# Patient Record
Sex: Female | Born: 1940 | Race: Black or African American | Hispanic: No | Marital: Single | Smoking: Heavy tobacco smoker
Health system: Southern US, Community
[De-identification: ages and names within clinical notes are randomized; demographics above are authoritative.]

## PROBLEM LIST (undated history)

## (undated) DIAGNOSIS — D496 Neoplasm of unspecified behavior of brain: Secondary | ICD-10-CM

## (undated) DIAGNOSIS — I10 Essential (primary) hypertension: Secondary | ICD-10-CM

## (undated) DIAGNOSIS — K219 Gastro-esophageal reflux disease without esophagitis: Secondary | ICD-10-CM

## (undated) DIAGNOSIS — E7800 Pure hypercholesterolemia, unspecified: Secondary | ICD-10-CM

## (undated) HISTORY — PX: BRAIN SURGERY: SHX531

## (undated) HISTORY — PX: ABDOMINAL HYSTERECTOMY: SHX81

---

## 2014-06-21 ENCOUNTER — Encounter (HOSPITAL_BASED_OUTPATIENT_CLINIC_OR_DEPARTMENT_OTHER): Payer: Self-pay | Admitting: *Deleted

## 2014-06-21 ENCOUNTER — Inpatient Hospital Stay (HOSPITAL_BASED_OUTPATIENT_CLINIC_OR_DEPARTMENT_OTHER)
Admission: EM | Admit: 2014-06-21 | Discharge: 2014-06-26 | DRG: 853 | Disposition: A | Discharge status: Home / Self Care | Payer: Medicare Other | Attending: Internal Medicine | Admitting: Internal Medicine

## 2014-06-21 ENCOUNTER — Emergency Department (HOSPITAL_BASED_OUTPATIENT_CLINIC_OR_DEPARTMENT_OTHER): Payer: Medicare Other

## 2014-06-21 ENCOUNTER — Inpatient Hospital Stay (HOSPITAL_COMMUNITY): Payer: Medicare Other

## 2014-06-21 DIAGNOSIS — E785 Hyperlipidemia, unspecified: Secondary | ICD-10-CM | POA: Diagnosis not present

## 2014-06-21 DIAGNOSIS — K858 Other acute pancreatitis without necrosis or infection: Secondary | ICD-10-CM

## 2014-06-21 DIAGNOSIS — R6521 Severe sepsis with septic shock: Secondary | ICD-10-CM | POA: Diagnosis not present

## 2014-06-21 DIAGNOSIS — K219 Gastro-esophageal reflux disease without esophagitis: Secondary | ICD-10-CM | POA: Diagnosis not present

## 2014-06-21 DIAGNOSIS — K851 Biliary acute pancreatitis without necrosis or infection: Secondary | ICD-10-CM | POA: Diagnosis present

## 2014-06-21 DIAGNOSIS — K8000 Calculus of gallbladder with acute cholecystitis without obstruction: Secondary | ICD-10-CM | POA: Diagnosis present

## 2014-06-21 DIAGNOSIS — A419 Sepsis, unspecified organism: Secondary | ICD-10-CM

## 2014-06-21 DIAGNOSIS — E860 Dehydration: Secondary | ICD-10-CM | POA: Diagnosis not present

## 2014-06-21 DIAGNOSIS — E876 Hypokalemia: Secondary | ICD-10-CM | POA: Diagnosis not present

## 2014-06-21 DIAGNOSIS — K8309 Other cholangitis: Secondary | ICD-10-CM | POA: Diagnosis present

## 2014-06-21 DIAGNOSIS — E861 Hypovolemia: Secondary | ICD-10-CM | POA: Diagnosis not present

## 2014-06-21 DIAGNOSIS — I10 Essential (primary) hypertension: Secondary | ICD-10-CM | POA: Diagnosis present

## 2014-06-21 DIAGNOSIS — I959 Hypotension, unspecified: Secondary | ICD-10-CM

## 2014-06-21 DIAGNOSIS — R109 Unspecified abdominal pain: Secondary | ICD-10-CM | POA: Insufficient documentation

## 2014-06-21 DIAGNOSIS — A4151 Sepsis due to Escherichia coli [E. coli]: Principal | ICD-10-CM | POA: Diagnosis present

## 2014-06-21 DIAGNOSIS — K830 Cholangitis: Secondary | ICD-10-CM | POA: Diagnosis not present

## 2014-06-21 DIAGNOSIS — N179 Acute kidney failure, unspecified: Secondary | ICD-10-CM | POA: Diagnosis not present

## 2014-06-21 DIAGNOSIS — R1013 Epigastric pain: Secondary | ICD-10-CM

## 2014-06-21 DIAGNOSIS — R17 Unspecified jaundice: Secondary | ICD-10-CM

## 2014-06-21 HISTORY — DX: Essential (primary) hypertension: I10

## 2014-06-21 HISTORY — DX: Neoplasm of unspecified behavior of brain: D49.6

## 2014-06-21 HISTORY — DX: Pure hypercholesterolemia, unspecified: E78.00

## 2014-06-21 HISTORY — DX: Gastro-esophageal reflux disease without esophagitis: K21.9

## 2014-06-21 LAB — URINE MICROSCOPIC-ADD ON

## 2014-06-21 LAB — COMPREHENSIVE METABOLIC PANEL
ALBUMIN: 3.5 g/dL (ref 3.5–5.2)
ALT: 269 U/L — ABNORMAL HIGH (ref 0–35)
AST: 196 U/L — AB (ref 0–37)
Alkaline Phosphatase: 226 U/L — ABNORMAL HIGH (ref 39–117)
Anion gap: 14 (ref 5–15)
BUN: 37 mg/dL — ABNORMAL HIGH (ref 6–23)
CALCIUM: 8.9 mg/dL (ref 8.4–10.5)
CO2: 21 mmol/L (ref 19–32)
CREATININE: 3.81 mg/dL — AB (ref 0.50–1.10)
Chloride: 102 mEq/L (ref 96–112)
GFR calc Af Amer: 13 mL/min — ABNORMAL LOW (ref >90)
GFR, EST NON AFRICAN AMERICAN: 11 mL/min — AB (ref >90)
Glucose, Bld: 121 mg/dL — ABNORMAL HIGH (ref 70–99)
Potassium: 3.9 mmol/L (ref 3.5–5.1)
Sodium: 137 mmol/L (ref 135–145)
Total Bilirubin: 6.9 mg/dL — ABNORMAL HIGH (ref 0.3–1.2)
Total Protein: 7.6 g/dL (ref 6.0–8.3)

## 2014-06-21 LAB — MRSA PCR SCREENING: MRSA BY PCR: NEGATIVE

## 2014-06-21 LAB — CBC WITH DIFFERENTIAL/PLATELET
BAND NEUTROPHILS: 17 % — AB (ref 0–10)
BASOS PCT: 0 % (ref 0–1)
Basophils Absolute: 0 10*3/uL (ref 0.0–0.1)
Blasts: 0 %
EOS PCT: 0 % (ref 0–5)
Eosinophils Absolute: 0 10*3/uL (ref 0.0–0.7)
HEMATOCRIT: 36.6 % (ref 36.0–46.0)
HEMOGLOBIN: 12.2 g/dL (ref 12.0–15.0)
LYMPHS ABS: 0.9 10*3/uL (ref 0.7–4.0)
LYMPHS PCT: 4 % — AB (ref 12–46)
MCH: 31.6 pg (ref 26.0–34.0)
MCHC: 33.3 g/dL (ref 30.0–36.0)
MCV: 94.8 fL (ref 78.0–100.0)
MONO ABS: 1.1 10*3/uL — AB (ref 0.1–1.0)
MONOS PCT: 5 % (ref 3–12)
Metamyelocytes Relative: 2 %
Myelocytes: 2 %
Neutro Abs: 20.3 10*3/uL — ABNORMAL HIGH (ref 1.7–7.7)
Neutrophils Relative %: 70 % (ref 43–77)
Platelets: 176 10*3/uL (ref 150–400)
Promyelocytes Absolute: 0 %
RBC: 3.86 MIL/uL — ABNORMAL LOW (ref 3.87–5.11)
RDW: 12.9 % (ref 11.5–15.5)
WBC: 22.3 10*3/uL — AB (ref 4.0–10.5)
nRBC: 0 /100 WBC

## 2014-06-21 LAB — TROPONIN I: TROPONIN I: 0.03 ng/mL (ref <0.031)

## 2014-06-21 LAB — URINALYSIS, ROUTINE W REFLEX MICROSCOPIC
Glucose, UA: NEGATIVE mg/dL
Hgb urine dipstick: NEGATIVE
Ketones, ur: 15 mg/dL — AB
Nitrite: POSITIVE — AB
Protein, ur: NEGATIVE mg/dL
Specific Gravity, Urine: 1.018 (ref 1.005–1.030)
UROBILINOGEN UA: 1 mg/dL (ref 0.0–1.0)
pH: 6 (ref 5.0–8.0)

## 2014-06-21 LAB — I-STAT CG4 LACTIC ACID, ED: Lactic Acid, Venous: 3.49 mmol/L — ABNORMAL HIGH (ref 0.5–2.2)

## 2014-06-21 LAB — LACTIC ACID, PLASMA: Lactic Acid, Venous: 1.6 mmol/L (ref 0.5–2.2)

## 2014-06-21 LAB — GLUCOSE, CAPILLARY: Glucose-Capillary: 73 mg/dL (ref 70–99)

## 2014-06-21 LAB — LIPASE, BLOOD: LIPASE: 578 U/L — AB (ref 11–59)

## 2014-06-21 MED ORDER — SODIUM CHLORIDE 0.9 % IV SOLN
INTRAVENOUS | Status: DC
  Administered 2014-06-21 – 2014-06-25 (×9): via INTRAVENOUS

## 2014-06-21 MED ORDER — HEPARIN SODIUM (PORCINE) 5000 UNIT/ML IJ SOLN
5000.0000 [IU] | Freq: Three times a day (TID) | INTRAMUSCULAR | Status: DC
Start: 2014-06-21 — End: 2014-06-26
  Administered 2014-06-21 – 2014-06-26 (×13): 5000 [IU] via SUBCUTANEOUS
  Filled 2014-06-21 (×18): qty 1

## 2014-06-21 MED ORDER — ONDANSETRON HCL 4 MG/2ML IJ SOLN: 4.0000 mg | Freq: Once | INTRAMUSCULAR | Status: DC

## 2014-06-21 MED ORDER — METRONIDAZOLE IN NACL 5-0.79 MG/ML-% IV SOLN
500.0000 mg | Freq: Three times a day (TID) | INTRAVENOUS | Status: DC
  Administered 2014-06-21 – 2014-06-26 (×15): 500 mg via INTRAVENOUS
  Filled 2014-06-21 (×17): qty 100

## 2014-06-21 MED ORDER — MORPHINE SULFATE 2 MG/ML IJ SOLN
1.0000 mg | INTRAMUSCULAR | Status: DC | PRN
  Administered 2014-06-21: 1 mg via INTRAVENOUS
  Administered 2014-06-22 – 2014-06-23 (×2): 2 mg via INTRAVENOUS
  Filled 2014-06-21 (×3): qty 1

## 2014-06-21 MED ORDER — PIPERACILLIN-TAZOBACTAM 3.375 G IVPB 30 MIN
3.3750 g | Freq: Once | INTRAVENOUS | Status: AC
  Administered 2014-06-21: 3.375 g via INTRAVENOUS
  Filled 2014-06-21 (×2): qty 50

## 2014-06-21 MED ORDER — CIPROFLOXACIN IN D5W 400 MG/200ML IV SOLN
400.0000 mg | INTRAVENOUS | Status: DC
  Administered 2014-06-21 – 2014-06-22 (×2): 400 mg via INTRAVENOUS
  Filled 2014-06-21 (×4): qty 200

## 2014-06-21 MED ORDER — SODIUM CHLORIDE 0.9 % IV SOLN
INTRAVENOUS | Status: DC
Start: 2014-06-21 — End: 2014-06-21

## 2014-06-21 MED ORDER — CETYLPYRIDINIUM CHLORIDE 0.05 % MT LIQD: 7.0000 mL | Freq: Two times a day (BID) | OROMUCOSAL | Status: DC

## 2014-06-21 MED ORDER — ONDANSETRON HCL 4 MG/2ML IJ SOLN: 4.0000 mg | Freq: Four times a day (QID) | INTRAMUSCULAR | Status: DC | PRN

## 2014-06-21 MED ORDER — CHLORHEXIDINE GLUCONATE 0.12 % MT SOLN
15.0000 mL | Freq: Two times a day (BID) | OROMUCOSAL | Status: DC
  Administered 2014-06-22: 15 mL via OROMUCOSAL
  Filled 2014-06-21: qty 15

## 2014-06-21 MED ORDER — PNEUMOCOCCAL VAC POLYVALENT 25 MCG/0.5ML IJ INJ
0.5000 mL | INJECTION | INTRAMUSCULAR | Status: AC
  Administered 2014-06-22: 0.5 mL via INTRAMUSCULAR
  Filled 2014-06-21: qty 0.5

## 2014-06-21 MED ORDER — SODIUM CHLORIDE 0.9 % IV BOLUS (SEPSIS)
500.0000 mL | Freq: Once | INTRAVENOUS | Status: AC
  Administered 2014-06-21: 500 mL via INTRAVENOUS

## 2014-06-21 MED ORDER — SODIUM CHLORIDE 0.9 % IV BOLUS (SEPSIS)
1000.0000 mL | Freq: Once | INTRAVENOUS | Status: AC
  Administered 2014-06-21: 1000 mL via INTRAVENOUS

## 2014-06-21 MED ORDER — PANTOPRAZOLE SODIUM 40 MG IV SOLR
40.0000 mg | INTRAVENOUS | Status: DC
  Administered 2014-06-21 – 2014-06-23 (×3): 40 mg via INTRAVENOUS
  Filled 2014-06-21 (×6): qty 40

## 2014-06-21 NOTE — ED Notes (Signed)
Critical Value Lactic Acid 3.49 reported to Dr. [name] and Tana Coast RN

## 2014-06-21 NOTE — ED Provider Notes (Signed)
CSN: [id number]     Arrival date & time 06/21/14  1103 History   First MD Initiated Contact with Patient 06/21/14 1120     Chief Complaint  Patient presents with  . Fatigue     (Consider location/radiation/quality/duration/timing/severity/associated sxs/prior Treatment) The history is provided by the patient and a relative.  Chelsea Walker is a 73 y.o. female hx of brain tumor s/p surgery in remission here with vomiting, abdominal pain. She's been having periumbilical pain for the last several days. Also has been vomiting everything she eats. She is visiting family from out of town. She had some subjective fevers. She has been feeling weak and dizzy but didn't pass out. Denies any chest pain or shortness of breath.   Past Medical History  Diagnosis Date  . Brain tumor    Past Surgical History  Procedure Laterality Date  . Brain surgery     No family history on file. History  Substance Use Topics  . Smoking status: Not on file  . Smokeless tobacco: Not on file  . Alcohol Use: Not on file   OB History    No data available     Review of Systems  Gastrointestinal: Positive for nausea, vomiting and abdominal pain.  All other systems reviewed and are negative.     Allergies  Review of patient's allergies indicates no known allergies.  Home Medications   Prior to Admission medications   Medication Sig Start Date End Date Taking? Authorizing Provider  Fish Oil OIL by Does not apply route.   Yes Historical Provider, MD  omeprazole (PRILOSEC) 20 MG capsule Take 20 mg by mouth daily.   Yes Historical Provider, MD  pravastatin (PRAVACHOL) 20 MG tablet Take 20 mg by mouth daily.   Yes Historical Provider, MD   BP 90/69 mmHg  Pulse 40  Temp(Src) 98.5 F (36.9 C) (Oral)  Resp 22  SpO2 94% Physical Exam  Constitutional: She is oriented to person, place, and time.  Ill appearing, dehydrated   HENT:  Head: Normocephalic.  MM dry   Eyes: Conjunctivae are normal. Pupils  are equal, round, and reactive to light.  Neck: Normal range of motion. Neck supple.  Cardiovascular: Normal rate, regular rhythm and normal heart sounds.   Pulmonary/Chest: Effort normal and breath sounds normal. No respiratory distress. She has no wheezes. She has no rales.  Abdominal:  Mild epigastric and periumbilical tenderness, no rebound   Musculoskeletal: Normal range of motion. She exhibits no edema or tenderness.  Neurological: She is alert and oriented to person, place, and time. No cranial nerve deficit. Coordination normal.  Skin: Skin is warm and dry.  Psychiatric: She has a normal mood and affect. Her behavior is normal. Judgment and thought content normal.  Nursing note and vitals reviewed.   ED Course  Procedures (including critical care time)  CRITICAL CARE Performed by: [name], Jeanie Mccard   Total critical care time: 30 min   Critical care time was exclusive of separately billable procedures and treating other patients.  Critical care was necessary to treat or prevent imminent or life-threatening deterioration.  Critical care was time spent personally by me on the following activities: development of treatment plan with patient and/or surrogate as well as nursing, discussions with consultants, evaluation of patient's response to treatment, examination of patient, obtaining history from patient or surrogate, ordering and performing treatments and interventions, ordering and review of laboratory studies, ordering and review of radiographic studies, pulse oximetry and re-evaluation of patient's condition.   Labs  Review Labs Reviewed  CBC WITH DIFFERENTIAL - Abnormal; Notable for the following:    WBC 22.3 (*)    RBC 3.86 (*)    Lymphocytes Relative 4 (*)    Band Neutrophils 17 (*)    Neutro Abs 20.3 (*)    Monocytes Absolute 1.1 (*)    All other components within normal limits  COMPREHENSIVE METABOLIC PANEL - Abnormal; Notable for the following:    Glucose, Bld 121 (*)     BUN 37 (*)    Creatinine, Ser 3.81 (*)    AST 196 (*)    ALT 269 (*)    Alkaline Phosphatase 226 (*)    Total Bilirubin 6.9 (*)    GFR calc non Af Amer 11 (*)    GFR calc Af Amer 13 (*)    All other components within normal limits  LIPASE, BLOOD - Abnormal; Notable for the following:    Lipase 578 (*)    All other components within normal limits  URINALYSIS, ROUTINE W REFLEX MICROSCOPIC - Abnormal; Notable for the following:    Color, Urine ORANGE (*)    Bilirubin Urine LARGE (*)    Ketones, ur 15 (*)    Nitrite POSITIVE (*)    Leukocytes, UA SMALL (*)    All other components within normal limits  URINE MICROSCOPIC-ADD ON - Abnormal; Notable for the following:    Bacteria, UA FEW (*)    All other components within normal limits  I-STAT CG4 LACTIC ACID, ED - Abnormal; Notable for the following:    Lactic Acid, Venous 3.49 (*)    All other components within normal limits  CULTURE, BLOOD (ROUTINE X 2)  CULTURE, BLOOD (ROUTINE X 2)  URINE CULTURE  TROPONIN I    Imaging Review Ct Abdomen Pelvis Wo Contrast  06/21/2014   CLINICAL DATA:  Vomiting with eating.  Back pain.  EXAM: CT ABDOMEN AND PELVIS WITHOUT CONTRAST  TECHNIQUE: Multidetector CT imaging of the abdomen and pelvis was performed following the standard protocol without IV contrast.  COMPARISON:  None.  FINDINGS: Linear densities at both lung bases are most compatible with atelectasis. Evidence for a small calcified granuloma at the left lung base. Negative for free intraperitoneal air.  There is high-density material in the gallbladder consistent with stones and/or sludge. Gallbladder is mildly distended. No gross abnormality to the liver, spleen or pancreas. Normal appearance of the adrenal glands and kidneys. Negative for kidney stones or hydronephrosis. Abdominal aorta measures up to 2.3 cm with atherosclerotic calcifications. No significant free fluid or lymphadenopathy. Uterus has been removed. Normal appearance of the  small bowel, appendix and colon. Evidence for a small splenule inferior to the spleen. There is some colonic diverticulosis without acute inflammation. Two linear densities in the rectum are consistent with ingested material. Urinary bladder is decompressed.  There is grade 2 anterolisthesis at L5-S1. There may be a right-sided pars defect at L5. Multilevel disc disease in lumbar spine. There is an umbilical hernia containing fat.  IMPRESSION: Cholelithiasis.  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   Electronically Signed   By: Markus Daft M.D.   On: 06/21/2014 13:28   Dg Chest 2 View  06/21/2014   CLINICAL DATA:  Vomiting for 2 days  EXAM: CHEST  2 VIEW  COMPARISON:  None.  FINDINGS: Cardiomediastinal silhouette is unremarkable. No acute infiltrate or pleural effusion. Osteopenia and mild degenerative changes lower thoracic and lumbar spine. Mild compression deformity upper endplate of  T11 vertebral body of indeterminate age.  IMPRESSION: No active disease. Mild degenerative changes thoracic spine. Mild compression deformity upper endplate of R97 vertebral body of indeterminate age.   Electronically Signed   By: Lahoma Crocker M.D.   On: 06/21/2014 13:48     EKG Interpretation   Date/Time:  Friday June 21 2014 11:35:10 EST Ventricular Rate:  108 PR Interval:  128 QRS Duration: 84 QT Interval:  328 QTC Calculation: 439 R Axis:   28 Text Interpretation:  Sinus tachycardia Otherwise normal ECG No previous  ECGs available Confirmed by Millard Bautch  MD, Talyn Dessert ([id number]) on 06/21/2014 11:43:25  AM      MDM   Final diagnoses:  Hypotension  Abdominal pain    Chelsea Walker is a 73 y.o. female here with ab pain, vomiting, hypotension. Consider AAA vs appy vs perforation vs dehydration from gastro. Will check labs, CT angio ab/pel, UA.   12 pm WBC 22. Still hypotensive after 1L NS. Code sepsis initiated. Has acute renal failure Cr. 3.8. LFTs elevated. Ordered zosyn.  Changed CT to noncontrast.   1:53 PM CT showed gallstones. Still hypotensive, on 3rd L NS. Concerned for possible cholangitis vs choledocholithiasis. Lipase 578, likely has pancreatitis as well. Will admit to ICU at [facility], Dr. Halford Chessman accepting doctor. General surgery consulted as well.     Wandra Arthurs, MD 06/21/14 [phone]

## 2014-06-21 NOTE — H&P (Signed)
PULMONARY / CRITICAL CARE MEDICINE   Name: Chelsea Walker MRN: [id number] DOB: December 27, 1940    ADMISSION DATE:  06/21/2014  REFERRING MD :  [name]  CHIEF COMPLAINT:  Abdominal pain  INITIAL PRESENTATION:  73 yo female with n/v and abdominal pain.  Hypotensive with elevated lactic in ER.  Found to have elevated WBC, lipase, LFT's with concern for gallstone pancreatitis.  PCCM asked to admit to ICU.  STUDIES:  12/25 CT abd/pelvis >> cholelithiasis, anterolithiasis L5-S1 12/25 Abd u/s >>  SIGNIFICANT EVENTS: 12/25 Admit, CCS/GI consulted  HISTORY OF PRESENT ILLNESS:   73 yo female visiting family for Christmas.  She ate Christmas dinner last night.  Afterward she developed nausea with vomiting.  This was associated with abdominal pain and back pain.  She went to ER.  She was found to have low blood pressure.  She had CT abd/pelvis which showed gallstones.  Her WBC, lipase, lactic acid and LFT's were elevated.  She was given zofran, IV fluids, and Abx >> she felt better after these interventions.  She was transferred to ICU with concern for sepsis in setting of gallstone pancreatitis.  She denies chest pain, dyspnea, fever, sweats, chills, or diarrhea.  She had normal bowel movement last night.  PAST MEDICAL HISTORY :   has a past medical history of Brain tumor.  has past surgical history that includes Brain surgery. Prior to Admission medications   Medication Sig Start Date End Date Taking? Authorizing Provider  Fish Oil OIL by Does not apply route.   Yes Historical Provider, MD  omeprazole (PRILOSEC) 20 MG capsule Take 20 mg by mouth daily.   Yes Historical Provider, MD  pravastatin (PRAVACHOL) 20 MG tablet Take 20 mg by mouth daily.   Yes Historical Provider, MD   No Known Allergies  FAMILY HISTORY:  has no family status information on file.  SOCIAL HISTORY:    REVIEW OF SYSTEMS:   Negative except above  SUBJECTIVE:   VITAL SIGNS: Temp:  [98.5 F (36.9 C)] 98.5 F (36.9 C)  (12/25 1113) Pulse Rate:  [36-109] 99 (12/25 1515) Resp:  [16-29] 29 (12/25 1515) BP: (69-99)/(42-81) 97/53 mmHg (12/25 1515) SpO2:  [94 %-100 %] 98 % (12/25 1515) INTAKE / OUTPUT:  Intake/Output Summary (Last 24 hours) at 06/21/14 1637 Last data filed at 06/21/14 1335  Gross per 24 hour  Intake   1050 ml  Output      0 ml  Net   1050 ml    PHYSICAL EXAMINATION: General:  Pleasant  Neuro:  Rt facial drop, normal strength, follows commands HEENT: no sinus tenderness, no oral exudate, dry oral mucosa Cardiovascular:  Regular, no murmur Lungs:  No wheeze/rales Abdomen:  Soft, non tender, negative Murphy's sign, + bowel sounds Musculoskeletal: no edema Skin:  No rashes  LABS:  CBC  Recent Labs Lab 06/21/14 1140  WBC 22.3*  HGB 12.2  HCT 36.6  PLT 176   Coag's No results for input(s): APTT, INR in the last 168 hours. BMET  Recent Labs Lab 06/21/14 1140  NA 137  K 3.9  CL 102  CO2 21  BUN 37*  CREATININE 3.81*  GLUCOSE 121*   Electrolytes  Recent Labs Lab 06/21/14 1140  CALCIUM 8.9   Sepsis Markers  Recent Labs Lab 06/21/14 1147  LATICACIDVEN 3.49*   ABG No results for input(s): PHART, PCO2ART, PO2ART in the last 168 hours. Liver Enzymes  Recent Labs Lab 06/21/14 1140  AST 196*  ALT 269*  ALKPHOS 226*  BILITOT 6.9*  ALBUMIN 3.5   Cardiac Enzymes  Recent Labs Lab 06/21/14 1140  TROPONINI 0.03   Glucose  Recent Labs Lab 06/21/14 1613  GLUCAP 73    Imaging No results found.   ASSESSMENT / PLAN:  PULMONARY A: No acute issues. P:   Bronchial hygiene  CARDIOVASCULAR A:  Sepsis 2nd to gallstone pancreatitis. Hx of HLD. P:  Continue IV fluids Defer CVL placement for now F/u lactic acid Hold pravachol  RENAL A:   AKI 2nd to sepsis and volume depletion >> uncertain about baseline renal fx. P:   Monitor renal fx, urine outpt, electrolytes while getting IV fluids  GASTROINTESTINAL A:   Gallstone  pancreatitis. P:   NPO Protonix for SUP GI, CCS consulted F/u abd u/s   HEMATOLOGIC A:   Leukocytosis. P:  F/u CBC SQ heparin for DVT prevention  INFECTIOUS A:   Sepsis from gallstone pancreatitis. P:   Day 1 cipro, flagyl  Blood cx 12/25 >> Urine cx 12/25 >>   ENDOCRINE A:   No acute issues. P:   Monitor blood sugar on BMET  NEUROLOGIC A:   Pain control. Hx of brain tumor s/p resection. P:   PRN morphine  Updated family at bedside 12/25.  Inter-disciplinary family meet or Palliative Care meeting due by 06/28/14:  Chelsea Mires, MD [place] 06/21/2014, 4:51 PM Pager:  [phone] After 3pm call: [phone]

## 2014-06-21 NOTE — Consult Note (Signed)
Reason for Consult:cholangitis, pancreatitis, gallstones Referring Physician: Dr. Governor Rooks  Chelsea Walker is an 73 y.o. female.  HPI: This patient was transferred in from [facility]. She is from out of town visiting family. She presents with a 3 to four-day history of abdominal pain, nausea, and vomiting. She reports this occurred after eating something fatty. The pain is also been in the epigastrium. She has subjective fevers as well as weakness and dizziness. She denies chest pain or shortness of breath. She has no previous history of abdominal pain. She has been moving her bowels normally. The pain was moderate and cramping in intensity. She denied hematemesis.  While at [facility], she was hypotensive. She was transferred here by CCM to the intensive care area.  Past Medical History  Diagnosis Date  . Brain tumor     Past Surgical History  Procedure Laterality Date  . Brain surgery      No family history on file.  Social History:  has no tobacco, alcohol, and drug history on file.  Allergies: No Known Allergies  Medications: I have reviewed the patient's current medications.  Results for orders placed or performed during the hospital encounter of 06/21/14 (from the past 48 hour(s))  CBC with Differential     Status: Abnormal   Collection Time: 06/21/14 11:40 AM  Result Value Ref Range   WBC 22.3 (H) 4.0 - 10.5 K/uL   RBC 3.86 (L) 3.87 - 5.11 MIL/uL   Hemoglobin 12.2 12.0 - 15.0 g/dL   HCT 36.6 36.0 - 46.0 %   MCV 94.8 78.0 - 100.0 fL   MCH 31.6 26.0 - 34.0 pg   MCHC 33.3 30.0 - 36.0 g/dL   RDW 12.9 11.5 - 15.5 %   Platelets 176 150 - 400 K/uL   Neutrophils Relative % 70 43 - 77 %   Lymphocytes Relative 4 (L) 12 - 46 %   Monocytes Relative 5 3 - 12 %   Eosinophils Relative 0 0 - 5 %   Basophils Relative 0 0 - 1 %   Band Neutrophils 17 (H) 0 - 10 %   Metamyelocytes Relative 2 %   Myelocytes 2 %   Promyelocytes Absolute 0 %   Blasts 0 %   nRBC 0 0  /100 WBC   Neutro Abs 20.3 (H) 1.7 - 7.7 K/uL   Lymphs Abs 0.9 0.7 - 4.0 K/uL   Monocytes Absolute 1.1 (H) 0.1 - 1.0 K/uL   Eosinophils Absolute 0.0 0.0 - 0.7 K/uL   Basophils Absolute 0.0 0.0 - 0.1 K/uL   WBC Morphology VACUOLATED NEUTROPHILS     Comment: DOHLE BODIES  Comprehensive metabolic panel     Status: Abnormal   Collection Time: 06/21/14 11:40 AM  Result Value Ref Range   Sodium 137 135 - 145 mmol/L    Comment: Please note change in reference range.   Potassium 3.9 3.5 - 5.1 mmol/L    Comment: Please note change in reference range.   Chloride 102 96 - 112 mEq/L   CO2 21 19 - 32 mmol/L   Glucose, Bld 121 (H) 70 - 99 mg/dL   BUN 37 (H) 6 - 23 mg/dL   Creatinine, Ser 3.81 (H) 0.50 - 1.10 mg/dL   Calcium 8.9 8.4 - 10.5 mg/dL   Total Protein 7.6 6.0 - 8.3 g/dL   Albumin 3.5 3.5 - 5.2 g/dL   AST 196 (H) 0 - 37 U/L   ALT 269 (H) 0 - 35  U/L   Alkaline Phosphatase 226 (H) 39 - 117 U/L   Total Bilirubin 6.9 (H) 0.3 - 1.2 mg/dL   GFR calc non Af Amer 11 (L) >90 mL/min   GFR calc Af Amer 13 (L) >90 mL/min    Comment: (NOTE) The eGFR has been calculated using the CKD EPI equation. This calculation has not been validated in all clinical situations. eGFR's persistently <90 mL/min signify possible Chronic Kidney Disease.    Anion gap 14 5 - 15  Lipase, blood     Status: Abnormal   Collection Time: 06/21/14 11:40 AM  Result Value Ref Range   Lipase 578 (H) 11 - 59 U/L    Comment: RESULTS CONFIRMED BY MANUAL DILUTION CALLED TO NICOLE PASHADA AT 13:20 BY S.ROY ON 06/21/14   Troponin I     Status: None   Collection Time: 06/21/14 11:40 AM  Result Value Ref Range   Troponin I 0.03 <0.031 ng/mL    Comment:        NO INDICATION OF MYOCARDIAL INJURY. Please note change in reference range.   I-Stat CG4 Lactic Acid, ED     Status: Abnormal   Collection Time: 06/21/14 11:47 AM  Result Value Ref Range   Lactic Acid, Venous 3.49 (H) 0.5 - 2.2 mmol/L  Urinalysis, Routine w reflex  microscopic     Status: Abnormal   Collection Time: 06/21/14 12:20 PM  Result Value Ref Range   Color, Urine ORANGE (A) YELLOW    Comment: BIOCHEMICALS MAY BE AFFECTED BY COLOR   APPearance CLEAR CLEAR   Specific Gravity, Urine 1.018 1.005 - 1.030   pH 6.0 5.0 - 8.0   Glucose, UA NEGATIVE NEGATIVE mg/dL   Hgb urine dipstick NEGATIVE NEGATIVE   Bilirubin Urine LARGE (A) NEGATIVE   Ketones, ur 15 (A) NEGATIVE mg/dL   Protein, ur NEGATIVE NEGATIVE mg/dL   Urobilinogen, UA 1.0 0.0 - 1.0 mg/dL   Nitrite POSITIVE (A) NEGATIVE   Leukocytes, UA SMALL (A) NEGATIVE  Urine microscopic-add on     Status: Abnormal   Collection Time: 06/21/14 12:20 PM  Result Value Ref Range   Squamous Epithelial / LPF RARE RARE   WBC, UA 0-2 <3 WBC/hpf   RBC / HPF 0-2 <3 RBC/hpf   Bacteria, UA FEW (A) RARE   Urine-Other MUCOUS PRESENT   Glucose, capillary     Status: None   Collection Time: 06/21/14  4:13 PM  Result Value Ref Range   Glucose-Capillary 73 70 - 99 mg/dL    Ct Abdomen Pelvis Wo Contrast  06/21/2014   CLINICAL DATA:  Vomiting with eating.  Back pain.  EXAM: CT ABDOMEN AND PELVIS WITHOUT CONTRAST  TECHNIQUE: Multidetector CT imaging of the abdomen and pelvis was performed following the standard protocol without IV contrast.  COMPARISON:  None.  FINDINGS: Linear densities at both lung bases are most compatible with atelectasis. Evidence for a small calcified granuloma at the left lung base. Negative for free intraperitoneal air.  There is high-density material in the gallbladder consistent with stones and/or sludge. Gallbladder is mildly distended. No gross abnormality to the liver, spleen or pancreas. Normal appearance of the adrenal glands and kidneys. Negative for kidney stones or hydronephrosis. Abdominal aorta measures up to 2.3 cm with atherosclerotic calcifications. No significant free fluid or lymphadenopathy. Uterus has been removed. Normal appearance of the small bowel, appendix and colon.  Evidence for a small splenule inferior to the spleen. There is some colonic diverticulosis without acute inflammation. Two linear  densities in the rectum are consistent with ingested material. Urinary bladder is decompressed.  There is grade 2 anterolisthesis at L5-S1. There may be a right-sided pars defect at L5. Multilevel disc disease in lumbar spine. There is an umbilical hernia containing fat.  IMPRESSION: Cholelithiasis.  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   Electronically Signed   By: Markus Daft M.D.   On: 06/21/2014 13:28   Dg Chest 2 View  06/21/2014   CLINICAL DATA:  Vomiting for 2 days  EXAM: CHEST  2 VIEW  COMPARISON:  None.  FINDINGS: Cardiomediastinal silhouette is unremarkable. No acute infiltrate or pleural effusion. Osteopenia and mild degenerative changes lower thoracic and lumbar spine. Mild compression deformity upper endplate of Z66 vertebral body of indeterminate age.  IMPRESSION: No active disease. Mild degenerative changes thoracic spine. Mild compression deformity upper endplate of A63 vertebral body of indeterminate age.   Electronically Signed   By: Lahoma Crocker M.D.   On: 06/21/2014 13:48    Review of Systems  All other systems reviewed and are negative.  Blood pressure 97/53, pulse 99, temperature 98.5 F (36.9 C), temperature source Oral, resp. rate 29, SpO2 98 %. Physical Exam  Constitutional: She is oriented to person, place, and time. She appears well-developed and well-nourished. No distress.  HENT:  Head: Normocephalic and atraumatic.  Right Ear: External ear normal.  Left Ear: External ear normal.  Nose: Nose normal.  Mouth/Throat: No oropharyngeal exudate.  Eyes: Conjunctivae are normal. Pupils are equal, round, and reactive to light. Scleral icterus is present.  Neck: Normal range of motion. No tracheal deviation present.  Cardiovascular: Normal heart sounds and intact distal pulses.   No murmur heard. Tachycardic  with regular rhythm  Respiratory: Effort normal and breath sounds normal. No respiratory distress. She has no wheezes.  GI: Soft. She exhibits no distension. There is tenderness.  There is only very mild tenderness in the epigastrium and right upper quadrant with minimal guarding.  Musculoskeletal: Normal range of motion. She exhibits no edema or tenderness.  Neurological: She is alert and oriented to person, place, and time.  Skin: Skin is warm and dry. No rash noted. She is not diaphoretic. No erythema.  Psychiatric: Her behavior is normal. Judgment normal.    Assessment/Plan: Cholangitis with pancreatitis, cholelithiasis, and possible choledocholithiasis.  Clinically, she actually looks better than her labs and vitals. Her abdominal exam is not very impressive. Nonetheless, her laboratory data does show a significantly elevated white blood count and liver function tests. I will order ultrasound of her right upper quadrant to better assess the gallbladder and bile duct. I recommend GI consultation to consider potential ERCP should be determined of the bile duct needs to be decompressed. She will remain nothing by mouth. She is being started on IV antibiotics. We will follow her closely with you.  Chandra Asher A 06/21/2014, 4:37 PM

## 2014-06-21 NOTE — ED Notes (Signed)
Pt here visiting family for Christmas- c/o abd pain and vomiting "couple days"- b/p  76/55- pt denies complaints

## 2014-06-21 NOTE — Progress Notes (Signed)
ANTIBIOTIC CONSULT NOTE - INITIAL  Pharmacy Consult for Cipro Indication: intra-abdominal infection  No Known Allergies  Patient Measurements:   Adjusted Body Weight:   Vital Signs: Temp: 98.5 F (36.9 C) (12/25 1113) Temp Source: Oral (12/25 1113) BP: 97/53 mmHg (12/25 1515) Pulse Rate: 99 (12/25 1515) Intake/Output from previous day:   Intake/Output from this shift: Total I/O In: 1050 [IV Piggyback:1050] Out: -   Labs:  Recent Labs  06/21/14 1140  WBC 22.3*  HGB 12.2  PLT 176  CREATININE 3.81*   CrCl cannot be calculated (Unknown ideal weight.).    Microbiology: No results found for this or any previous visit (from the past 720 hour(s)).  Medical History: Past Medical History  Diagnosis Date  . Brain tumor     Medications:  Prescriptions prior to admission  Medication Sig Dispense Refill Last Dose  . Fish Oil OIL by Does not apply route.     Marland Kitchen omeprazole (PRILOSEC) 20 MG capsule Take 20 mg by mouth daily.     . pravastatin (PRAVACHOL) 20 MG tablet Take 20 mg by mouth daily.      Assessment: 73 yo F presented to ED with several days history of vomiting and abdominal pain.  Pt is hypotensive, WBC 22.3, e.evated LA 3.49.  Pts SCr 3.8 with estimated CrCl ~ 15 ml/min.  Goal of Therapy:  Renal dose adjustment of medications  Plan:  Cipro 400mg  IV q24h Follow up culture data and clinical progress.  [employer], Pharm.D., BCPS Clinical Pharmacist Pager [phone] 06/21/2014 4:42 PM

## 2014-06-21 NOTE — Consult Note (Addendum)
Referring Provider: Dr. Halford Chessman Primary Care Physician:  Pcp Not In System Primary Gastroenterologist:  Althia Forts  Reason for Consultation:  Pancreatitis; Elevated LFTs  HPI: Chelsea Walker is a 73 y.o. female who had the acute onset of epigastric abdominal pain last night with associated chills. Pain was sharp, stabbing, and was the worse pain in her life. Abdominal pain also radiated into her back. Denies any abdominal pain like this episode prior to last night. Denies associated N/V/fevers/diarrhea/melena/hematochezia. She was hypotensive on presentation to the ER at 69/44 and has responded with IVFs to the low 782'U systolic/ 50 's - 23'N and WBC 22.3, TB 6.9, ALP 226, AST 196, ALT 269. Lipase 578. Noncontrast CT showed gallstones with mild distention of the gallbladder without pancreatitis. U/S showed gallstones with mild gallbladder distention; CBD normal in diameter. She came in town yesterday to visit her daughter for the holidays.   Past Medical History  Diagnosis Date  . Brain tumor   . Hypertension   . GERD (gastroesophageal reflux disease)   . High cholesterol     Past Surgical History  Procedure Laterality Date  . Brain surgery    . Abdominal hysterectomy      Prior to Admission medications   Medication Sig Start Date End Date Taking? Authorizing Provider  Fish Oil OIL by Does not apply route.   Yes Historical Provider, MD  omeprazole (PRILOSEC) 20 MG capsule Take 20 mg by mouth daily.   Yes Historical Provider, MD  pravastatin (PRAVACHOL) 20 MG tablet Take 20 mg by mouth daily.   Yes Historical Provider, MD  lisinopril (PRINIVIL,ZESTRIL) 10 MG tablet Take 10 mg by mouth daily. 05/30/14   Historical Provider, MD    Scheduled Meds: . ciprofloxacin  400 mg Intravenous Q24H  . heparin subcutaneous  5,000 Units Subcutaneous 3 times per day  . metronidazole  500 mg Intravenous Q8H  . ondansetron (ZOFRAN) IV  4 mg Intravenous Once  . pantoprazole (PROTONIX) IV  40 mg  Intravenous Q24H   Continuous Infusions: . sodium chloride 100 mL/hr at 06/21/14 1712   PRN Meds:.morphine injection, ondansetron  Allergies as of 06/21/2014  . (No Known Allergies)    History reviewed. No pertinent family history.  History   Social History  . Marital Status: Single    Spouse Name: N/A    Number of Children: N/A  . Years of Education: N/A   Occupational History  . Not on file.   Social History Main Topics  . Smoking status: Heavy Tobacco Smoker  . Smokeless tobacco: Current User    Types: Chew  . Alcohol Use: Not on file  . Drug Use: Not on file  . Sexual Activity: Not on file   Other Topics Concern  . Not on file   Social History Narrative  . No narrative on file    Review of Systems: All negative from GI standpoint except as stated above in HPI.  Physical Exam: Vital signs: Filed Vitals:   06/21/14 1700  BP: 99/74  Pulse: 95  Temp: 98.4  Resp: 18   Last BM Date: 06/20/14 General:   Elderly, Alert,  Well-developed, well-nourished, pleasant and cooperative in NAD HEENT: +scleral icterus Neck: nontender Lungs:  Clear throughout to auscultation.   No wheezes, crackles, or rhonchi. No acute distress. Heart:  Regular rate and rhythm; no murmurs, clicks, rubs,  or gallops. Abdomen: minimal tenderness in epigastric and RUQ quadrant with guarding, soft, nondistended, +BS  Rectal:  Deferred Ext: no edema  GI:  Lab Results:  Recent Labs  06/21/14 1140  WBC 22.3*  HGB 12.2  HCT 36.6  PLT 176   BMET  Recent Labs  06/21/14 1140  NA 137  K 3.9  CL 102  CO2 21  GLUCOSE 121*  BUN 37*  CREATININE 3.81*  CALCIUM 8.9   LFT  Recent Labs  06/21/14 1140  PROT 7.6  ALBUMIN 3.5  AST 196*  ALT 269*  ALKPHOS 226*  BILITOT 6.9*   PT/INR No results for input(s): LABPROT, INR in the last 72 hours.   Studies/Results: Ct Abdomen Pelvis Wo Contrast  06/21/2014   CLINICAL DATA:  Vomiting with eating.  Back pain.  EXAM: CT  ABDOMEN AND PELVIS WITHOUT CONTRAST  TECHNIQUE: Multidetector CT imaging of the abdomen and pelvis was performed following the standard protocol without IV contrast.  COMPARISON:  None.  FINDINGS: Linear densities at both lung bases are most compatible with atelectasis. Evidence for a small calcified granuloma at the left lung base. Negative for free intraperitoneal air.  There is high-density material in the gallbladder consistent with stones and/or sludge. Gallbladder is mildly distended. No gross abnormality to the liver, spleen or pancreas. Normal appearance of the adrenal glands and kidneys. Negative for kidney stones or hydronephrosis. Abdominal aorta measures up to 2.3 cm with atherosclerotic calcifications. No significant free fluid or lymphadenopathy. Uterus has been removed. Normal appearance of the small bowel, appendix and colon. Evidence for a small splenule inferior to the spleen. There is some colonic diverticulosis without acute inflammation. Two linear densities in the rectum are consistent with ingested material. Urinary bladder is decompressed.  There is grade 2 anterolisthesis at L5-S1. There may be a right-sided pars defect at L5. Multilevel disc disease in lumbar spine. There is an umbilical hernia containing fat.  IMPRESSION: Cholelithiasis.  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   Electronically Signed   By: Markus Daft M.D.   On: 06/21/2014 13:28   Dg Chest 2 View  06/21/2014   CLINICAL DATA:  Vomiting for 2 days  EXAM: CHEST  2 VIEW  COMPARISON:  None.  FINDINGS: Cardiomediastinal silhouette is unremarkable. No acute infiltrate or pleural effusion. Osteopenia and mild degenerative changes lower thoracic and lumbar spine. Mild compression deformity upper endplate of Y19 vertebral body of indeterminate age.  IMPRESSION: No active disease. Mild degenerative changes thoracic spine. Mild compression deformity upper endplate of J09 vertebral body of  indeterminate age.   Electronically Signed   By: Lahoma Crocker M.D.   On: 06/21/2014 13:48   US Abdomen Limited Ruq  06/21/2014   CLINICAL DATA:  Cholangitis  EXAM: US ABDOMEN LIMITED - RIGHT UPPER QUADRANT  COMPARISON:  None.  FINDINGS: Gallbladder:  Multiple small gallstones are present. Mild gallbladder wall thickening. No pericholecystic fluid. No Murphy sign.  Common bile duct:  Diameter: 6 mm in caliber  Liver:  No focal lesion identified. Within normal limits in parenchymal echogenicity.  IMPRESSION: Cholelithiasis.  Nonspecific mild gallbladder wall thickening. Correlate clinically as for the need for nuclear medicine imaging. Please note that the sonographic Percell Miller sign was negative.   Electronically Signed   By: Maryclare Bean M.D.   On: 06/21/2014 18:41    Impression/Plan:  73 yo with biliary pancreatitis with normal-sized common bile duct on U/S. I do NOT think she has cholangitis and would recommend continued broad-spectrum antibiotics, bowel rest, and IVFs. If LFTs continue to rise, then may need an ERCP otherwise would recommend  continue Abx, bowel rest, and supportive care. Patient reports having a steel plate in her head so an MRCP would not be an option. Discussed with Dr. Deatra Ina in case an ERCP is needed this weekend. Will follow up tomorrow.   LOS: 0 days   [place] C.  06/21/2014, 7:24 PM

## 2014-06-21 NOTE — ED Notes (Signed)
MD at bedside. 

## 2014-06-22 DIAGNOSIS — A4151 Sepsis due to Escherichia coli [E. coli]: Secondary | ICD-10-CM | POA: Diagnosis not present

## 2014-06-22 DIAGNOSIS — A4150 Gram-negative sepsis, unspecified: Secondary | ICD-10-CM

## 2014-06-22 DIAGNOSIS — R7881 Bacteremia: Secondary | ICD-10-CM

## 2014-06-22 LAB — CBC
HCT: 28.5 % — ABNORMAL LOW (ref 36.0–46.0)
Hemoglobin: 9.3 g/dL — ABNORMAL LOW (ref 12.0–15.0)
MCH: 30.5 pg (ref 26.0–34.0)
MCHC: 32.6 g/dL (ref 30.0–36.0)
MCV: 93.4 fL (ref 78.0–100.0)
PLATELETS: 143 10*3/uL — AB (ref 150–400)
RBC: 3.05 MIL/uL — ABNORMAL LOW (ref 3.87–5.11)
RDW: 13.7 % (ref 11.5–15.5)
WBC: 15.2 10*3/uL — ABNORMAL HIGH (ref 4.0–10.5)

## 2014-06-22 LAB — URINE CULTURE
CULTURE: NO GROWTH
Colony Count: NO GROWTH

## 2014-06-22 LAB — COMPREHENSIVE METABOLIC PANEL
ALK PHOS: 142 U/L — AB (ref 39–117)
ALT: 133 U/L — ABNORMAL HIGH (ref 0–35)
ANION GAP: 9 (ref 5–15)
AST: 71 U/L — ABNORMAL HIGH (ref 0–37)
Albumin: 2 g/dL — ABNORMAL LOW (ref 3.5–5.2)
BILIRUBIN TOTAL: 2.3 mg/dL — AB (ref 0.3–1.2)
BUN: 34 mg/dL — AB (ref 6–23)
CHLORIDE: 113 meq/L — AB (ref 96–112)
CO2: 17 mmol/L — ABNORMAL LOW (ref 19–32)
CREATININE: 3.05 mg/dL — AB (ref 0.50–1.10)
Calcium: 7.3 mg/dL — ABNORMAL LOW (ref 8.4–10.5)
GFR calc Af Amer: 17 mL/min — ABNORMAL LOW (ref >90)
GFR, EST NON AFRICAN AMERICAN: 14 mL/min — AB (ref >90)
Glucose, Bld: 71 mg/dL (ref 70–99)
POTASSIUM: 3.6 mmol/L (ref 3.5–5.1)
Sodium: 139 mmol/L (ref 135–145)
Total Protein: 5.2 g/dL — ABNORMAL LOW (ref 6.0–8.3)

## 2014-06-22 LAB — LIPASE, BLOOD: Lipase: 31 U/L (ref 11–59)

## 2014-06-22 MED ORDER — SODIUM CHLORIDE 0.9 % IV BOLUS (SEPSIS)
250.0000 mL | Freq: Once | INTRAVENOUS | Status: AC
  Administered 2014-06-22: 250 mL via INTRAVENOUS

## 2014-06-22 NOTE — Procedures (Signed)
Arterial Catheter Insertion Procedure Note Alainah Phang [id number] 1940/10/11  Procedure: Insertion of Arterial Catheter  Indications: Blood pressure monitoring  Procedure Details Consent: Risks of procedure as well as the alternatives and risks of each were explained to the (patient/caregiver).  Consent for procedure obtained. Time Out: Verified patient identification, verified procedure, site/side was marked, verified correct patient position, special equipment/implants available, medications/allergies/relevent history reviewed, required imaging and test results available.  Performed  Maximum sterile technique was used including antiseptics, cap, gloves, gown, hand hygiene, mask and sheet. Skin prep: Chlorhexidine; local anesthetic administered 20 gauge catheter was inserted into right radial artery using the Seldinger technique.  Evaluation Blood flow good; BP tracing good. Complications: No apparent complications.   Marlowe Aschoff 06/22/2014

## 2014-06-22 NOTE — Progress Notes (Signed)
PULMONARY / CRITICAL CARE MEDICINE   Name: Chelsea Walker MRN: [id number] DOB: 1940-11-21    ADMISSION DATE:  06/21/2014  REFERRING MD :  [name]  CHIEF COMPLAINT:  Abdominal pain  INITIAL PRESENTATION: 73 y/o female with n/v and abdominal pain.  Hypotensive with elevated lactic in ER.  Found to have elevated WBC, lipase, LFT's with concern for gallstone pancreatitis.  PCCM asked to admit to ICU.  STUDIES:  12/25  CT abd/pelvis >> cholelithiasis, anterolithiasis L5-S1 12/25  ABD [place] >> cholelithiasis, non-specific mild gallbladder wall thickening, CBD ok  SIGNIFICANT EVENTS: 12/25 Admit, CCS/GI consulted   SUBJECTIVE: Pt denies abd pain, hopeful to eat.  No acute events.  BP improving  VITAL SIGNS: Temp:  [97.7 F (36.5 C)-98.5 F (36.9 C)] 98.5 F (36.9 C) (12/26 0738) Pulse Rate:  [36-122] 80 (12/26 0800) Resp:  [11-29] 13 (12/26 0800) BP: (67-108)/(33-81) 96/33 mmHg (12/26 0145) SpO2:  [94 %-100 %] 97 % (12/26 0800) Arterial Line BP: (82-122)/(49-76) 112/58 mmHg (12/26 0800) Weight:  [189 lb 9.5 oz (86 kg)] 189 lb 9.5 oz (86 kg) (12/25 1615)   INTAKE / OUTPUT:  Intake/Output Summary (Last 24 hours) at 06/22/14 0852 Last data filed at 06/22/14 0800  Gross per 24 hour  Intake   3070 ml  Output    650 ml  Net   2420 ml    PHYSICAL EXAMINATION: General:  Pleasant elderly female in NAD Neuro:  Rt facial drop, normal strength, follows commands HEENT: mm pink/dry, no jvd Cardiovascular:  Regular, no murmur Lungs:  Clear bilaterally, no wheeze/rales Abdomen:  Soft, non tender, negative Murphy's sign, + bowel sounds Musculoskeletal: no edema Skin:  No rashes  LABS:  CBC  Recent Labs Lab 06/21/14 1140 06/22/14 0520  WBC 22.3* 15.2*  HGB 12.2 9.3*  HCT 36.6 28.5*  PLT 176 143*   BMET  Recent Labs Lab 06/21/14 1140 06/22/14 0520  NA 137 139  K 3.9 3.6  CL 102 113*  CO2 21 17*  BUN 37* 34*  CREATININE 3.81* 3.05*  GLUCOSE 121* 71    Electrolytes  Recent Labs Lab 06/21/14 1140 06/22/14 0520  CALCIUM 8.9 7.3*   Sepsis Markers  Recent Labs Lab 06/21/14 1147 06/21/14 1700  LATICACIDVEN 3.49* 1.6   Liver Enzymes  Recent Labs Lab 06/21/14 1140 06/22/14 0520  AST 196* 71*  ALT 269* 133*  ALKPHOS 226* 142*  BILITOT 6.9* 2.3*  ALBUMIN 3.5 2.0*   Cardiac Enzymes  Recent Labs Lab 06/21/14 1140  TROPONINI 0.03   Glucose  Recent Labs Lab 06/21/14 1613  GLUCAP 73    Imaging Ct Abdomen Pelvis Wo Contrast  06/21/2014   CLINICAL DATA:  Vomiting with eating.  Back pain.  EXAM: CT ABDOMEN AND PELVIS WITHOUT CONTRAST  TECHNIQUE: Multidetector CT imaging of the abdomen and pelvis was performed following the standard protocol without IV contrast.  COMPARISON:  None.  FINDINGS: Linear densities at both lung bases are most compatible with atelectasis. Evidence for a small calcified granuloma at the left lung base. Negative for free intraperitoneal air.  There is high-density material in the gallbladder consistent with stones and/or sludge. Gallbladder is mildly distended. No gross abnormality to the liver, spleen or pancreas. Normal appearance of the adrenal glands and kidneys. Negative for kidney stones or hydronephrosis. Abdominal aorta measures up to 2.3 cm with atherosclerotic calcifications. No significant free fluid or lymphadenopathy. Uterus has been removed. Normal appearance of the small bowel, appendix and colon. Evidence for a small splenule  inferior to the spleen. There is some colonic diverticulosis without acute inflammation. Two linear densities in the rectum are consistent with ingested material. Urinary bladder is decompressed.  There is grade 2 anterolisthesis at L5-S1. There may be a right-sided pars defect at L5. Multilevel disc disease in lumbar spine. There is an umbilical hernia containing fat.  IMPRESSION: Cholelithiasis.  Multilevel disc disease in the lumbar spine with anterolisthesis at  L5-S1.   Electronically Signed   By: Markus Daft M.D.   On: 06/21/2014 13:28   Dg Chest 2 View  06/21/2014   CLINICAL DATA:  Vomiting for 2 days  EXAM: CHEST  2 VIEW  COMPARISON:  None.  FINDINGS: Cardiomediastinal silhouette is unremarkable. No acute infiltrate or pleural effusion. Osteopenia and mild degenerative changes lower thoracic and lumbar spine. Mild compression deformity upper endplate of N82 vertebral body of indeterminate age.  IMPRESSION: No active disease. Mild degenerative changes thoracic spine. Mild compression deformity upper endplate of N56 vertebral body of indeterminate age.   Electronically Signed   By: Lahoma Crocker M.D.   On: 06/21/2014 13:48   US Abdomen Limited Ruq  06/21/2014   CLINICAL DATA:  Cholangitis  EXAM: US ABDOMEN LIMITED - RIGHT UPPER QUADRANT  COMPARISON:  None.  FINDINGS: Gallbladder:  Multiple small gallstones are present. Mild gallbladder wall thickening. No pericholecystic fluid. No Murphy sign.  Common bile duct:  Diameter: 6 mm in caliber  Liver:  No focal lesion identified. Within normal limits in parenchymal echogenicity.  IMPRESSION: Cholelithiasis.  Nonspecific mild gallbladder wall thickening. Correlate clinically as for the need for nuclear medicine imaging. Please note that the sonographic Percell Miller sign was negative.   Electronically Signed   By: Maryclare Bean M.D.   On: 06/21/2014 18:41     ASSESSMENT / PLAN:  PULMONARY A: No acute issues. P:   Bronchial hygiene  CARDIOVASCULAR A:  Sepsis 2nd to gallstone pancreatitis. Hx of HLD. P:  Continue IV fluids, NS @ 100 ml/hr Clearing of lactic acid Hold pravachol D/c aline  RENAL A:   AKI 2nd to sepsis and volume depletion >> uncertain about baseline renal fx. P:   Monitor renal fx, urine outpt, electrolytes while getting IV fluids NS as above If to have cholecystectomy, would need to see improvement in her renal function  GASTROINTESTINAL A:   Gallstone pancreatitis. P:   Clear liquid  diet Protonix for SUP GI, CCS consulted GI deferring ERCP  HEMATOLOGIC A:   Leukocytosis. P:  F/u CBC SQ heparin for DVT prevention  INFECTIOUS A:   Sepsis from gallstone pancreatitis. GNR Bacteremia  P:   Abx: Cipro, start date 12/25, day  2/x Abx: Flagyl, start date 12/25, day 2/x  Blood cx 12/25 >> GNR Urine cx 12/25 >>   Rx with abx.  No immediate plans for surgery.  Will follow up with [facility]CS in 4 weeks as outpatient.  ENDOCRINE A:   No acute issues. P:   Monitor blood sugar on BMET  NEUROLOGIC A:   Pain control. Hx of brain tumor s/p resection. P:   PRN morphine  Family:  No family available at bedside 12/26 am.   Inter-disciplinary family meet or Palliative Care meeting due by 06/28/14:  SDU Status, Tx to [place] svc in am 12/27 as of 0700.    Noe Gens, NP-C [place] [facility] Pgr: [phone] or [phone]  Reviewed above, examined.  Pain symptoms better.  She did well with diet this AM.  Abdomen soft, breath sounds  clear.  Her aline BP is much higher than cuff pressure readings.  She has GNR in blood >> will continue IV abx pending cx results.  Continue IV fluids.  F/u renal fx, LFT, CBC.  Okay to transfer to SDU.  Will ask [facility] to assume care from 12/27 and PCCM sign off.  [name], MD [facility] 06/22/2014, 10:54 AM Pager:  [phone] After 3pm call: [phone]

## 2014-06-22 NOTE — Progress Notes (Signed)
Subjective: Denies abd pain, n/v. hypoTN slowly improving.   Objective: Vital signs in last 24 hours: Temp:  [97.7 F (36.5 C)-98.5 F (36.9 C)] 98.5 F (36.9 C) (12/26 0738) Pulse Rate:  [36-122] 80 (12/26 0800) Resp:  [11-29] 13 (12/26 0800) BP: (67-108)/(33-81) 96/33 mmHg (12/26 0145) SpO2:  [94 %-100 %] 97 % (12/26 0800) Arterial Line BP: (82-122)/(49-76) 112/58 mmHg (12/26 0800) Weight:  [189 lb 9.5 oz (86 kg)] 189 lb 9.5 oz (86 kg) (12/25 1615) Last BM Date: 06/20/14  Intake/Output from previous day: 12/25 0701 - 12/26 0700 In: 2850 [I.V.:1280; IV Piggyback:1450] Out: 350 [Urine:350] Intake/Output this shift:    Asleep, easily awakens, approp cta b/l Reg Soft, nt, nd  Lab Results:   Recent Labs  06/21/14 1140 06/22/14 0520  WBC 22.3* 15.2*  HGB 12.2 9.3*  HCT 36.6 28.5*  PLT 176 143*   BMET  Recent Labs  06/21/14 1140 06/22/14 0520  NA 137 139  K 3.9 3.6  CL 102 113*  CO2 21 17*  GLUCOSE 121* 71  BUN 37* 34*  CREATININE 3.81* 3.05*  CALCIUM 8.9 7.3*   Hepatic Function Latest Ref Rng 06/22/2014 06/21/2014  Total Protein 6.0 - 8.3 g/dL 5.2(L) 7.6  Albumin 3.5 - 5.2 g/dL 2.0(L) 3.5  AST 0 - 37 U/L 71(H) 196(H)  ALT 0 - 35 U/L 133(H) 269(H)  Alk Phosphatase 39 - 117 U/L 142(H) 226(H)  Total Bilirubin 0.3 - 1.2 mg/dL 2.3(H) 6.9(H)     PT/INR No results for input(s): LABPROT, INR in the last 72 hours. ABG No results for input(s): PHART, HCO3 in the last 72 hours.  Invalid input(s): PCO2, PO2  Studies/Results: Ct Abdomen Pelvis Wo Contrast  06/21/2014   CLINICAL DATA:  Vomiting with eating.  Back pain.  EXAM: CT ABDOMEN AND PELVIS WITHOUT CONTRAST  TECHNIQUE: Multidetector CT imaging of the abdomen and pelvis was performed following the standard protocol without IV contrast.  COMPARISON:  None.  FINDINGS: Linear densities at both lung bases are most compatible with atelectasis. Evidence for a small calcified granuloma at the left lung  base. Negative for free intraperitoneal air.  There is high-density material in the gallbladder consistent with stones and/or sludge. Gallbladder is mildly distended. No gross abnormality to the liver, spleen or pancreas. Normal appearance of the adrenal glands and kidneys. Negative for kidney stones or hydronephrosis. Abdominal aorta measures up to 2.3 cm with atherosclerotic calcifications. No significant free fluid or lymphadenopathy. Uterus has been removed. Normal appearance of the small bowel, appendix and colon. Evidence for a small splenule inferior to the spleen. There is some colonic diverticulosis without acute inflammation. Two linear densities in the rectum are consistent with ingested material. Urinary bladder is decompressed.  There is grade 2 anterolisthesis at L5-S1. There may be a right-sided pars defect at L5. Multilevel disc disease in lumbar spine. There is an umbilical hernia containing fat.  IMPRESSION: Cholelithiasis.  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   Electronically Signed   By: Markus Daft M.D.   On: 06/21/2014 13:28   Dg Chest 2 View  06/21/2014   CLINICAL DATA:  Vomiting for 2 days  EXAM: CHEST  2 VIEW  COMPARISON:  None.  FINDINGS: Cardiomediastinal silhouette is unremarkable. No acute infiltrate or pleural effusion. Osteopenia and mild degenerative changes lower thoracic and lumbar spine. Mild compression deformity upper endplate of N36 vertebral body of indeterminate age.  IMPRESSION: No active disease. Mild degenerative changes thoracic spine.  Mild compression deformity upper endplate of H99 vertebral body of indeterminate age.   Electronically Signed   By: Lahoma Crocker M.D.   On: 06/21/2014 13:48   US Abdomen Limited Ruq  06/21/2014   CLINICAL DATA:  Cholangitis  EXAM: US ABDOMEN LIMITED - RIGHT UPPER QUADRANT  COMPARISON:  None.  FINDINGS: Gallbladder:  Multiple small gallstones are present. Mild gallbladder wall thickening. No  pericholecystic fluid. No Murphy sign.  Common bile duct:  Diameter: 6 mm in caliber  Liver:  No focal lesion identified. Within normal limits in parenchymal echogenicity.  IMPRESSION: Cholelithiasis.  Nonspecific mild gallbladder wall thickening. Correlate clinically as for the need for nuclear medicine imaging. Please note that the sonographic Percell Miller sign was negative.   Electronically Signed   By: Maryclare Bean M.D.   On: 06/21/2014 18:41    Anti-infectives: Anti-infectives    Start     Dose/Rate Route Frequency Ordered Stop   06/21/14 1800  metroNIDAZOLE (FLAGYL) IVPB 500 mg     500 mg100 mL/hr over 60 Minutes Intravenous Every 8 hours 06/21/14 1630     06/21/14 1700  ciprofloxacin (CIPRO) IVPB 400 mg     400 mg200 mL/hr over 60 Minutes Intravenous Every 24 hours 06/21/14 1643     06/21/14 1230  piperacillin-tazobactam (ZOSYN) IVPB 3.375 g     3.375 g100 mL/hr over 30 Minutes Intravenous  Once 06/21/14 1223 06/21/14 1326      Assessment/Plan: Active Problems:   Gallstone pancreatitis   Sepsis   AKI (acute kidney injury)   Abdominal pain  BP getting better; LFTs trending down, Lipase nml, WBC trending down and Cr slightly better. U/s just showed gallstones with perhaps some mild GB wall thickening. CBD appeared ok.   Cont to follow daily CMET Cont abx Can start some clears Is this her baseline kidney function? If not, then Cr needs to improve before cholecystectomy  Leighton Ruff. Redmond Pulling, MD, FACS General, Bariatric, & Minimally Invasive Surgery [facility], [place]    LOS: 1 day    Gayland Curry 06/22/2014

## 2014-06-22 NOTE — Progress Notes (Addendum)
Patient ID: Chelsea Walker, female   DOB: August 02, 1940, 73 y.o.   MRN: [id number] [facility] Progress Note  Rosemond Lyttle 73 y.o. 10/26/1940   Subjective: Feels better. Hungry. Denies abdominal pain/N/V.  Objective: Vital signs in last 24 hours: Filed Vitals:   06/22/14 0800  BP: 96/33  Pulse: 80  Temp: 98.5  Resp: 13    Physical Exam: Gen: elderly, no acute distress, well-nourished Abd: soft, nontender, nondistended, +BS  Lab Results:  Recent Labs  06/21/14 1140 06/22/14 0520  NA 137 139  K 3.9 3.6  CL 102 113*  CO2 21 17*  GLUCOSE 121* 71  BUN 37* 34*  CREATININE 3.81* 3.05*  CALCIUM 8.9 7.3*    Recent Labs  06/21/14 1140 06/22/14 0520  AST 196* 71*  ALT 269* 133*  ALKPHOS 226* 142*  BILITOT 6.9* 2.3*  PROT 7.6 5.2*  ALBUMIN 3.5 2.0*    Recent Labs  06/21/14 1140 06/22/14 0520  WBC 22.3* 15.2*  NEUTROABS 20.3*  --   HGB 12.2 9.3*  HCT 36.6 28.5*  MCV 94.8 93.4  PLT 176 143*   No results for input(s): LABPROT, INR in the last 72 hours.    Assessment/Plan: Gallstone pancreatitis - likely passed a stone. LFTs and WBC improving. Lipase normal now. ERCP NOT needed at this time. Cholecystectomy next step when pancreatitis better and will likely be done as an outpt. Start clear liquid diet today and slowly advance to low fat diet. Will follow.   [place] C. 06/22/2014, 8:55 AM

## 2014-06-22 NOTE — Progress Notes (Signed)
Report called to Armida Sans, RN.  Pt has no s/s of any acute distress or c/o pain.  Will notify next of kin of transfer.

## 2014-06-23 DIAGNOSIS — N179 Acute kidney failure, unspecified: Secondary | ICD-10-CM

## 2014-06-23 DIAGNOSIS — A419 Sepsis, unspecified organism: Secondary | ICD-10-CM

## 2014-06-23 DIAGNOSIS — K859 Acute pancreatitis, unspecified: Secondary | ICD-10-CM

## 2014-06-23 DIAGNOSIS — K8020 Calculus of gallbladder without cholecystitis without obstruction: Secondary | ICD-10-CM

## 2014-06-23 LAB — CBC
HCT: 29.5 % — ABNORMAL LOW (ref 36.0–46.0)
HEMOGLOBIN: 9.6 g/dL — AB (ref 12.0–15.0)
MCH: 29.9 pg (ref 26.0–34.0)
MCHC: 32.5 g/dL (ref 30.0–36.0)
MCV: 91.9 fL (ref 78.0–100.0)
PLATELETS: 145 10*3/uL — AB (ref 150–400)
RBC: 3.21 MIL/uL — ABNORMAL LOW (ref 3.87–5.11)
RDW: 13.6 % (ref 11.5–15.5)
WBC: 7.6 10*3/uL (ref 4.0–10.5)

## 2014-06-23 LAB — BASIC METABOLIC PANEL
Anion gap: 4 — ABNORMAL LOW (ref 5–15)
BUN: 22 mg/dL (ref 6–23)
CO2: 20 mmol/L (ref 19–32)
Calcium: 8.1 mg/dL — ABNORMAL LOW (ref 8.4–10.5)
Chloride: 114 mEq/L — ABNORMAL HIGH (ref 96–112)
Creatinine, Ser: 1.64 mg/dL — ABNORMAL HIGH (ref 0.50–1.10)
GFR, EST AFRICAN AMERICAN: 35 mL/min — AB (ref >90)
GFR, EST NON AFRICAN AMERICAN: 30 mL/min — AB (ref >90)
GLUCOSE: 98 mg/dL (ref 70–99)
Potassium: 3.3 mmol/L — ABNORMAL LOW (ref 3.5–5.1)
SODIUM: 138 mmol/L (ref 135–145)

## 2014-06-23 MED ORDER — CIPROFLOXACIN IN D5W 400 MG/200ML IV SOLN
400.0000 mg | Freq: Two times a day (BID) | INTRAVENOUS | Status: DC
  Administered 2014-06-23 – 2014-06-26 (×8): 400 mg via INTRAVENOUS
  Filled 2014-06-23 (×10): qty 200

## 2014-06-23 MED ORDER — POTASSIUM CHLORIDE CRYS ER 20 MEQ PO TBCR
40.0000 meq | EXTENDED_RELEASE_TABLET | Freq: Four times a day (QID) | ORAL | Status: AC
  Administered 2014-06-23 (×2): 40 meq via ORAL
  Filled 2014-06-23 (×2): qty 2

## 2014-06-23 NOTE — Progress Notes (Signed)
[facility] PROGRESS NOTE  Chelsea Walker IDP:[id number] DOB: 06/07/1941 DOA: 06/21/2014 PCP: Pcp Not In System  Assessment/Plan: 1. Gallstone pancreatitis -Clinical improvement over the last 24 hours. Lipase trending down from 578 on 06/21/2014 2 31 x 12 26 2015 -Liver enzymes also continued to trend down -Patient tolerating by mouth intake -Gen. surgery recommending cholecystectomy.  2.  Severe Sepsis -Present on admission, evidenced by development of acute renal failure, lactic acid of 3.49, white count of 22,300, hypotension, gram-negative rods in blood culture, with source of infection acute cholecystitis -Awaiting ID and susceptibility testing me well will continue ciprofloxacin and Flagyl as she is showing clinical improvement -Continue IV fluids with normal saline running at 100 mL/hour  3.  Acute kidney injury. -Secondary to sepsis and hypovolemia, initially presenting with a creatinine of 3.81 -Trending down with a.m. lab work on 06/23/2014 showing creatinine of 1.64 -Continue IV fluids, supportive care  4.  Hypokalemia. -And laboratory potassium of 3.3 Will replace with 40 mEq of K Dur  Code Status: Full code Family Communication:  Disposition Plan: General surgery recommending cholecystectomy   Consultants:  General surgery  GI  Antibiotics:  Ciprofloxacin  Flagyl  HPI/Subjective: Patient is a pleasant 73 year old female with a history of dyslipidemia and gastroesophageal reflux disease who presented as a transfer from [place], where she initially presented with complaints of abdominal pain associated with nausea and vomiting. She denied hematemesis, bloody stools, melena. She was found to be hypotensive for which she was initially transferred to the intensive care unit at [facility]. She had a CT scan of abdomen and pelvis performed on 06/21/2014 which showed evidence of cholelithiasis. There was high density material in the  gallbladder consistent with stones and/or sludge. Labs revealed an elevated lipase of 578 and elevated liver enzymes with an AST of 196, ALT of 269, total bilirubin of 6.9. She was also found to be in acute renal failure having creatinine of 3.81. She was started on empiric IV antimicrobial therapy with ciprofloxacin and Flagyl. Symptoms felt to be secondary to gallstone pancreatitis. General surgery and GI were consulted. After IV fluid resuscitation her creatinine trended down to 1.64 by 06/23/2014. Patient showed gradual clinical improvement and was transferred to 6N on 06/22/2014. GI did not feel that ERCP was needed as stone had likely been past. Diet was advanced. Gen. surgery recommending cholecystectomy.   Objective: Filed Vitals:   06/23/14 0612  BP: 117/82  Pulse: 82  Temp: 98.4 F (36.9 C)  Resp: 17    Intake/Output Summary (Last 24 hours) at 06/23/14 1409 Last data filed at 06/23/14 4431  Gross per 24 hour  Intake   1882 ml  Output    400 ml  Net   1482 ml   Filed Weights   06/21/14 1615  Weight: 86 kg (189 lb 9.5 oz)    Exam:   General:  Patient is in no acute distress she is sitting at bedside chair awake and alert tolerating her breakfast  Cardiovascular: Regular rate and rhythm normal S1-S2  Respiratory: Normal respiratory effort, lungs are clear  Abdomen: Soft nontender nondistended  Musculoskeletal: No edema  Data Reviewed: Basic Metabolic Panel:  Recent Labs Lab 06/21/14 1140 06/22/14 0520 06/23/14 0650  NA 137 139 138  K 3.9 3.6 3.3*  CL 102 113* 114*  CO2 21 17* 20  GLUCOSE 121* 71 98  BUN 37* 34* 22  CREATININE 3.81* 3.05* 1.64*  CALCIUM 8.9 7.3* 8.1*   Liver Function  Tests:  Recent Labs Lab July 18, 2014 1140 06/22/14 0520  AST 196* 71*  ALT 269* 133*  ALKPHOS 226* 142*  BILITOT 6.9* 2.3*  PROT 7.6 5.2*  ALBUMIN 3.5 2.0*    Recent Labs Lab 07-18-14 1140 06/22/14 0520  LIPASE 578* 31   No results for input(s): AMMONIA in the  last 168 hours. CBC:  Recent Labs Lab 07-18-2014 1140 06/22/14 0520 06/23/14 0650  WBC 22.3* 15.2* 7.6  NEUTROABS 20.3*  --   --   HGB 12.2 9.3* 9.6*  HCT 36.6 28.5* 29.5*  MCV 94.8 93.4 91.9  PLT 176 143* 145*   Cardiac Enzymes:  Recent Labs Lab July 18, 2014 1140  TROPONINI 0.03   BNP (last 3 results) No results for input(s): PROBNP in the last 8760 hours. CBG:  Recent Labs Lab 2014/07/18 1613  GLUCAP 73    Recent Results (from the past 240 hour(s))  Blood culture (routine x 2)     Status: None (Preliminary result)   Collection Time: 07-18-2014 11:40 AM  Result Value Ref Range Status   Specimen Description BLOOD RIGHT ARM  Final   Special Requests BOTTLES DRAWN AEROBIC AND ANAEROBIC 5CC EACH  Final   Culture   Final    GRAM NEGATIVE RODS Note: Gram Stain Report Called to,Read Back By and Verified With: MELODIE WRIGHT 06/22/14 AT 0720 RIDK Performed at [facility]    Report Status PENDING  Incomplete  Blood culture (routine x 2)     Status: None (Preliminary result)   Collection Time: 18-Jul-2014 12:15 PM  Result Value Ref Range Status   Specimen Description BLOOD LEFT HAND  Final   Special Requests BOTTLES DRAWN AEROBIC ONLY 2CC  Final   Culture   Final           BLOOD CULTURE RECEIVED NO GROWTH TO DATE CULTURE WILL BE HELD FOR 5 DAYS BEFORE ISSUING A FINAL NEGATIVE REPORT Performed at [facility]    Report Status PENDING  Incomplete  Urine culture     Status: None   Collection Time: 18-Jul-2014 12:20 PM  Result Value Ref Range Status   Specimen Description URINE, CATHETERIZED  Final   Special Requests NONE  Final   Colony Count NO GROWTH Performed at [facility]   Final   Culture NO GROWTH Performed at [facility]   Final   Report Status 06/22/2014 FINAL  Final  MRSA PCR Screening     Status: None   Collection Time: 2014/07/18  5:34 PM  Result Value Ref Range Status   MRSA by PCR NEGATIVE NEGATIVE Final    Comment:        The  GeneXpert MRSA Assay (FDA approved for NASAL specimens only), is one component of a comprehensive MRSA colonization surveillance program. It is not intended to diagnose MRSA infection nor to guide or monitor treatment for MRSA infections.      Studies: US Abdomen Limited Ruq  07-18-14   CLINICAL DATA:  Cholangitis  EXAM: US ABDOMEN LIMITED - RIGHT UPPER QUADRANT  COMPARISON:  None.  FINDINGS: Gallbladder:  Multiple small gallstones are present. Mild gallbladder wall thickening. No pericholecystic fluid. No Murphy sign.  Common bile duct:  Diameter: 6 mm in caliber  Liver:  No focal lesion identified. Within normal limits in parenchymal echogenicity.  IMPRESSION: Cholelithiasis.  Nonspecific mild gallbladder wall thickening. Correlate clinically as for the need for nuclear medicine imaging. Please note that the sonographic Percell Miller sign was negative.   Electronically Signed  By: Maryclare Bean M.D.   On: 06/21/2014 18:41    Scheduled Meds: . ciprofloxacin  400 mg Intravenous Q12H  . heparin subcutaneous  5,000 Units Subcutaneous 3 times per day  . metronidazole  500 mg Intravenous Q8H  . ondansetron (ZOFRAN) IV  4 mg Intravenous Once  . pantoprazole (PROTONIX) IV  40 mg Intravenous Q24H   Continuous Infusions: . sodium chloride 100 mL/hr at 06/23/14 1139    Active Problems:   Cholangitis   Gallstone pancreatitis   Sepsis   AKI (acute kidney injury)   Abdominal pain    Time spent: 35 minutes     Kelvin Cellar  [facility] Pager [phone]. If 7PM-7AM, please contact night-coverage at [email or web address], password [facility] 06/23/2014, 2:09 PM  LOS: 2 days

## 2014-06-23 NOTE — Progress Notes (Addendum)
ANTIBIOTIC CONSULT NOTE - FOLLOW UP  Pharmacy Consult for Ciprofloxacin Indication: Intra-abdominal infection  No Known Allergies  Patient Measurements: Height: 5\' 6"  (167.6 cm) Weight: 189 lb 9.5 oz (86 kg) IBW/kg (Calculated) : 59.3 Adjusted Body Weight:   Vital Signs: Temp: 98.4 F (36.9 C) (12/27 0612) Temp Source: Oral (12/27 0612) BP: 117/82 mmHg (12/27 0612) Pulse Rate: 82 (12/27 0612) Intake/Output from previous day: 12/26 0701 - 12/27 0700 In: 3682 [P.O.:1180; I.V.:800; IV Piggyback:1662] Out: 700 [Urine:700] Intake/Output from this shift:    Labs:  Recent Labs  06/21/14 1140 06/22/14 0520 06/23/14 0650  WBC 22.3* 15.2* 7.6  HGB 12.2 9.3* 9.6*  PLT 176 143* 145*  CREATININE 3.81* 3.05* 1.64*   Estimated Creatinine Clearance: 34.3 mL/min (by C-G formula based on Cr of 1.64). No results for input(s): VANCOTROUGH, VANCOPEAK, VANCORANDOM, GENTTROUGH, GENTPEAK, GENTRANDOM, TOBRATROUGH, TOBRAPEAK, TOBRARND, AMIKACINPEAK, AMIKACINTROU, AMIKACIN in the last 72 hours.   Microbiology: Recent Results (from the past 720 hour(s))  Blood culture (routine x 2)     Status: None (Preliminary result)   Collection Time: 06/21/14 11:40 AM  Result Value Ref Range Status   Specimen Description BLOOD RIGHT ARM  Final   Special Requests BOTTLES DRAWN AEROBIC AND ANAEROBIC 5CC EACH  Final   Culture   Final    GRAM NEGATIVE RODS Note: Gram Stain Report Called to,Read Back By and Verified With: MELODIE WRIGHT 06/22/14 AT 0720 RIDK Performed at [facility]    Report Status PENDING  Incomplete  Blood culture (routine x 2)     Status: None (Preliminary result)   Collection Time: 06/21/14 12:15 PM  Result Value Ref Range Status   Specimen Description BLOOD LEFT HAND  Final   Special Requests BOTTLES DRAWN AEROBIC ONLY 2CC  Final   Culture   Final           BLOOD CULTURE RECEIVED NO GROWTH TO DATE CULTURE WILL BE HELD FOR 5 DAYS BEFORE ISSUING A FINAL NEGATIVE  REPORT Performed at [facility]    Report Status PENDING  Incomplete  Urine culture     Status: None   Collection Time: 06/21/14 12:20 PM  Result Value Ref Range Status   Specimen Description URINE, CATHETERIZED  Final   Special Requests NONE  Final   Colony Count NO GROWTH Performed at [facility]   Final   Culture NO GROWTH Performed at [facility]   Final   Report Status 06/22/2014 FINAL  Final  MRSA PCR Screening     Status: None   Collection Time: 06/21/14  5:34 PM  Result Value Ref Range Status   MRSA by PCR NEGATIVE NEGATIVE Final    Comment:        The GeneXpert MRSA Assay (FDA approved for NASAL specimens only), is one component of a comprehensive MRSA colonization surveillance program. It is not intended to diagnose MRSA infection nor to guide or monitor treatment for MRSA infections.     Anti-infectives    Start     Dose/Rate Route Frequency Ordered Stop   06/21/14 1800  metroNIDAZOLE (FLAGYL) IVPB 500 mg     500 mg100 mL/hr over 60 Minutes Intravenous Every 8 hours 06/21/14 1630     06/21/14 1700  ciprofloxacin (CIPRO) IVPB 400 mg     400 mg200 mL/hr over 60 Minutes Intravenous Every 24 hours 06/21/14 1643     06/21/14 1230  piperacillin-tazobactam (ZOSYN) IVPB 3.375 g     3.375 g100 mL/hr over  30 Minutes Intravenous  Once 06/21/14 1223 06/21/14 1326      Assessment: 73yo female on Cipro and Flagyl for abdominal infection, blood cx #1/2 (+)GNR.  WBC much improved and wnl today; pt is Afeb.  Cr is recovering as well, decreased to 1.64 and will need to adjust Cipro for improved renal fxn.  Goal of Therapy:  resolution of infection  Plan:  Change Cipro to 400mg  IV q12 F/U blood cx speciation Continue to monitor clinical status Pt is tolerating Reg diet, consider changing to Bernard, PharmD Clinical Pharmacist [facility]    Addum:  Pt is s/p lap chole with plans to dc  soon.  Renal function stable.  Pharmacy will sign off  Excell Seltzer, PharmD

## 2014-06-23 NOTE — Progress Notes (Signed)
Patient ID: Chelsea Walker, female   DOB: 10/12/1940, 73 y.o.   MRN: [id number] [facility] Progress Note  Chelsea Walker 73 y.o. [birth year]   Subjective: Feels good. Tolerating POs. Denies abdominal pain. Daughter at bedside with cousin.  Objective: Vital signs: Filed Vitals:   06/23/14 0612  BP: 117/82  Pulse: 82  Temp: 98.4 F (36.9 C)  Resp: 17    Physical Exam: Gen: alert, no acute distress, elderly  Abd: soft, nontender, nondistended  Lab Results:  Recent Labs  06/22/14 0520 06/23/14 0650  NA 139 138  K 3.6 3.3*  CL 113* 114*  CO2 17* 20  GLUCOSE 71 98  BUN 34* 22  CREATININE 3.05* 1.64*  CALCIUM 7.3* 8.1*    Recent Labs  06/21/14 1140 06/22/14 0520  AST 196* 71*  ALT 269* 133*  ALKPHOS 226* 142*  BILITOT 6.9* 2.3*  PROT 7.6 5.2*  ALBUMIN 3.5 2.0*    Recent Labs  06/21/14 1140 06/22/14 0520 06/23/14 0650  WBC 22.3* 15.2* 7.6  NEUTROABS 20.3*  --   --   HGB 12.2 9.3* 9.6*  HCT 36.6 28.5* 29.5*  MCV 94.8 93.4 91.9  PLT 176 143* 145*      Assessment/Plan: Resolving gallstone pancreatitis - likely passed a stone. LFTs normalizing. Cholecystectomy when deemed appropriate by surgery. Will sign off. Call with questions.   Denning C. 06/23/2014, 3:10 PM

## 2014-06-23 NOTE — Progress Notes (Addendum)
Subjective: Alert. No complaints. Denies nausea. Denies abdominal pain. Tolerating diet without any GI symptoms. BP 117/82. Heart rate 82. SPO2 98% on room air. Creatinine has gone from 3.05 to 1.64.  AST 71. A LT 133. Total bilirubin 2.3.. Lipase 31  Objective: Vital signs in last 24 hours: Temp:  [98.1 F (36.7 C)-98.9 F (37.2 C)] 98.4 F (36.9 C) (12/27 0612) Pulse Rate:  [76-113] 82 (12/27 0612) Resp:  [16-21] 17 (12/27 0612) BP: (108-123)/(67-89) 117/82 mmHg (12/27 0612) SpO2:  [96 %-100 %] 98 % (12/27 0612) Arterial Line BP: (123-135)/(65-70) 135/70 mmHg (12/26 1100) Last BM Date: 06/22/14  Intake/Output from previous day: 12/26 0701 - 12/27 0700 In: 3682 [P.O.:1180; I.V.:800; IV Piggyback:1662] Out: 700 [Urine:700] Intake/Output this shift:    General appearance: Alert. Cooperative. No distress. Mental status normal. GI: Abdomen soft. Nontender. Nondistended. No mass.  Lab Results:   Recent Labs  06/22/14 0520 06/23/14 0650  WBC 15.2* 7.6  HGB 9.3* 9.6*  HCT 28.5* 29.5*  PLT 143* 145*   BMET  Recent Labs  06/22/14 0520 06/23/14 0650  NA 139 138  K 3.6 3.3*  CL 113* 114*  CO2 17* 20  GLUCOSE 71 98  BUN 34* 22  CREATININE 3.05* 1.64*  CALCIUM 7.3* 8.1*   PT/INR No results for input(s): LABPROT, INR in the last 72 hours. ABG No results for input(s): PHART, HCO3 in the last 72 hours.  Invalid input(s): PCO2, PO2  Studies/Results: Ct Abdomen Pelvis Wo Contrast  06/21/2014   CLINICAL DATA:  Vomiting with eating.  Back pain.  EXAM: CT ABDOMEN AND PELVIS WITHOUT CONTRAST  TECHNIQUE: Multidetector CT imaging of the abdomen and pelvis was performed following the standard protocol without IV contrast.  COMPARISON:  None.  FINDINGS: Linear densities at both lung bases are most compatible with atelectasis. Evidence for a small calcified granuloma at the left lung base. Negative for free intraperitoneal air.  There is high-density material in the  gallbladder consistent with stones and/or sludge. Gallbladder is mildly distended. No gross abnormality to the liver, spleen or pancreas. Normal appearance of the adrenal glands and kidneys. Negative for kidney stones or hydronephrosis. Abdominal aorta measures up to 2.3 cm with atherosclerotic calcifications. No significant free fluid or lymphadenopathy. Uterus has been removed. Normal appearance of the small bowel, appendix and colon. Evidence for a small splenule inferior to the spleen. There is some colonic diverticulosis without acute inflammation. Two linear densities in the rectum are consistent with ingested material. Urinary bladder is decompressed.  There is grade 2 anterolisthesis at L5-S1. There may be a right-sided pars defect at L5. Multilevel disc disease in lumbar spine. There is an umbilical hernia containing fat.  IMPRESSION: Cholelithiasis.  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   Electronically Signed   By: Markus Daft M.D.   On: 06/21/2014 13:28   Dg Chest 2 View  06/21/2014   CLINICAL DATA:  Vomiting for 2 days  EXAM: CHEST  2 VIEW  COMPARISON:  None.  FINDINGS: Cardiomediastinal silhouette is unremarkable. No acute infiltrate or pleural effusion. Osteopenia and mild degenerative changes lower thoracic and lumbar spine. Mild compression deformity upper endplate of E99 vertebral body of indeterminate age.  IMPRESSION: No active disease. Mild degenerative changes thoracic spine. Mild compression deformity upper endplate of B71 vertebral body of indeterminate age.   Electronically Signed   By: Lahoma Crocker M.D.   On: 06/21/2014 13:48   US Abdomen Limited Ruq  06/21/2014  CLINICAL DATA:  Cholangitis  EXAM: US ABDOMEN LIMITED - RIGHT UPPER QUADRANT  COMPARISON:  None.  FINDINGS: Gallbladder:  Multiple small gallstones are present. Mild gallbladder wall thickening. No pericholecystic fluid. No Murphy sign.  Common bile duct:  Diameter: 6 mm in caliber   Liver:  No focal lesion identified. Within normal limits in parenchymal echogenicity.  IMPRESSION: Cholelithiasis.  Nonspecific mild gallbladder wall thickening. Correlate clinically as for the need for nuclear medicine imaging. Please note that the sonographic Percell Miller sign was negative.   Electronically Signed   By: Maryclare Bean M.D.   On: 06/21/2014 18:41    Anti-infectives: Anti-infectives    Start     Dose/Rate Route Frequency Ordered Stop   06/21/14 1800  metroNIDAZOLE (FLAGYL) IVPB 500 mg     500 mg100 mL/hr over 60 Minutes Intravenous Every 8 hours 06/21/14 1630     06/21/14 1700  ciprofloxacin (CIPRO) IVPB 400 mg     400 mg200 mL/hr over 60 Minutes Intravenous Every 24 hours 06/21/14 1643     06/21/14 1230  piperacillin-tazobactam (ZOSYN) IVPB 3.375 g     3.375 g100 mL/hr over 30 Minutes Intravenous  Once 06/21/14 1223 06/21/14 1326      Assessment/Plan: Gallstone pancreatitis. Resolving, although LFTs and bilirubin not normalized yet. Lipase normal. Suspect she passed a stone. Cannot rule out retained CBD stone and will need cholangiogram at time of cholecystectomy. Ultrasound only showed gallstones with mild gallbladder wall thickening. CBD not dilated Continue antibiotics.  Allow diet. Lab work Architectural technologist.  Sepsis with GNR bacteremia and elevated lactic acid. This has now cleared.  Acute kidney injury. Likely secondary to sepsis and transient shock state. Improving. Would like to see this normalized prior to cholecystectomy  History craniotomy for tumor   I told her that she would benefit from cholecystectomy. I explained the pathophysiology of gallstones causing pancreatitis due to passage of stones, and possibility of recurrence. I explained laparoscopic cholecystectomy to her. She wants to think about this.  LOS: 2 days    Chelsea Walker M 06/23/2014

## 2014-06-24 DIAGNOSIS — K830 Cholangitis: Secondary | ICD-10-CM

## 2014-06-24 DIAGNOSIS — A4151 Sepsis due to Escherichia coli [E. coli]: Principal | ICD-10-CM

## 2014-06-24 LAB — CBC
HEMATOCRIT: 29.7 % — AB (ref 36.0–46.0)
Hemoglobin: 9.9 g/dL — ABNORMAL LOW (ref 12.0–15.0)
MCH: 30.9 pg (ref 26.0–34.0)
MCHC: 33.3 g/dL (ref 30.0–36.0)
MCV: 92.8 fL (ref 78.0–100.0)
Platelets: 144 10*3/uL — ABNORMAL LOW (ref 150–400)
RBC: 3.2 MIL/uL — ABNORMAL LOW (ref 3.87–5.11)
RDW: 13.7 % (ref 11.5–15.5)
WBC: 7.1 10*3/uL (ref 4.0–10.5)

## 2014-06-24 LAB — LIPASE, BLOOD: Lipase: 29 U/L (ref 11–59)

## 2014-06-24 LAB — COMPREHENSIVE METABOLIC PANEL
ALK PHOS: 138 U/L — AB (ref 39–117)
ALT: 71 U/L — ABNORMAL HIGH (ref 0–35)
AST: 27 U/L (ref 0–37)
Albumin: 2.4 g/dL — ABNORMAL LOW (ref 3.5–5.2)
Anion gap: 3 — ABNORMAL LOW (ref 5–15)
BUN: 13 mg/dL (ref 6–23)
CHLORIDE: 116 meq/L — AB (ref 96–112)
CO2: 21 mmol/L (ref 19–32)
Calcium: 8.3 mg/dL — ABNORMAL LOW (ref 8.4–10.5)
Creatinine, Ser: 1.07 mg/dL (ref 0.50–1.10)
GFR, EST AFRICAN AMERICAN: 59 mL/min — AB (ref >90)
GFR, EST NON AFRICAN AMERICAN: 51 mL/min — AB (ref >90)
GLUCOSE: 112 mg/dL — AB (ref 70–99)
Potassium: 4.2 mmol/L (ref 3.5–5.1)
SODIUM: 140 mmol/L (ref 135–145)
Total Bilirubin: 1.7 mg/dL — ABNORMAL HIGH (ref 0.3–1.2)
Total Protein: 6 g/dL (ref 6.0–8.3)

## 2014-06-24 LAB — CULTURE, BLOOD (ROUTINE X 2)

## 2014-06-24 LAB — SURGICAL PCR SCREEN
MRSA, PCR: NEGATIVE
Staphylococcus aureus: POSITIVE — AB

## 2014-06-24 LAB — CBG MONITORING, ED: Glucose-Capillary: 100 mg/dL — ABNORMAL HIGH (ref 70–99)

## 2014-06-24 MED ORDER — PANTOPRAZOLE SODIUM 40 MG PO TBEC
40.0000 mg | DELAYED_RELEASE_TABLET | Freq: Every day | ORAL | Status: DC
  Administered 2014-06-24 – 2014-06-26 (×3): 40 mg via ORAL
  Filled 2014-06-24 (×3): qty 1

## 2014-06-24 MED ORDER — CHLORHEXIDINE GLUCONATE CLOTH 2 % EX PADS
6.0000 | MEDICATED_PAD | Freq: Every day | CUTANEOUS | Status: DC
  Administered 2014-06-24 – 2014-06-26 (×3): 6 via TOPICAL

## 2014-06-24 MED ORDER — HYDROCODONE-ACETAMINOPHEN 5-325 MG PO TABS
1.0000 | ORAL_TABLET | ORAL | Status: DC | PRN
  Administered 2014-06-24: 2 via ORAL
  Administered 2014-06-25 – 2014-06-26 (×2): 1 via ORAL
  Filled 2014-06-24 (×2): qty 2
  Filled 2014-06-24: qty 1

## 2014-06-24 MED ORDER — MUPIROCIN 2 % EX OINT
1.0000 "application " | TOPICAL_OINTMENT | Freq: Two times a day (BID) | CUTANEOUS | Status: DC
  Administered 2014-06-24 – 2014-06-26 (×5): 1 via NASAL
  Filled 2014-06-24: qty 22

## 2014-06-24 NOTE — Anesthesia Preprocedure Evaluation (Addendum)
Anesthesia Evaluation  Patient identified by MRN, date of birth, ID band Patient awake    Reviewed: Allergy & Precautions, H&P , NPO status , Patient's Chart, lab work & pertinent test results, reviewed documented beta blocker date and time   Airway Mallampati: II  TM Distance: >3 FB Neck ROM: Full    Dental  (+) Missing, Chipped,    Pulmonary Current Smoker,  breath sounds clear to auscultation        Cardiovascular hypertension, Pt. on medications Rhythm:Regular Rate:Normal  EKG sinus tach   Neuro/Psych    GI/Hepatic GERD-  Medicated and Controlled,Gallstone Pancreatitis, resolving   Endo/Other  Morbid obesity  Renal/GU GFR 60     Musculoskeletal   Abdominal Normal abdominal exam  (+)  Abdomen: soft. Bowel sounds: normal.  Peds  Hematology  (+) anemia , H/H 10/30   Anesthesia Other Findings   Reproductive/Obstetrics                           Anesthesia Physical Anesthesia Plan  ASA: III  Anesthesia Plan: General   Post-op Pain Management:    Induction: Intravenous  Airway Management Planned: Oral ETT  Additional Equipment:   Intra-op Plan:   Post-operative Plan: Extubation in OR  Informed Consent: I have reviewed the patients History and Physical, chart, labs and discussed the procedure including the risks, benefits and alternatives for the proposed anesthesia with the patient or authorized representative who has indicated his/her understanding and acceptance.     Plan Discussed with:   Anesthesia Plan Comments:         Anesthesia Quick Evaluation

## 2014-06-24 NOTE — Progress Notes (Signed)
[facility] Progress Note     Subjective: Pt doing okay, but through she may be going home today.  Interested in surgery, but worried about "going under".  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  She understands this surgery is not as invasive as that.  She denies N/V, tolerating diet well.  Ambulating some.  Abdominal pain improved.    Objective: Vital signs in last 24 hours: Temp:  [98.2 F (36.8 C)-98.8 F (37.1 C)] 98.5 F (36.9 C) (12/28 0512) Pulse Rate:  [82-86] 83 (12/28 0512) Resp:  [17-18] 17 (12/28 0512) BP: (131-133)/(84-85) 131/84 mmHg (12/28 0512) SpO2:  [95 %-98 %] 98 % (12/28 0512) Last BM Date: 06/23/14  Intake/Output from previous day: 12/27 0701 - 12/28 0700 In: 3200 [P.O.:900; I.V.:2000; IV Piggyback:300] Out: -  Intake/Output this shift:    PE: Gen:  Alert, NAD, pleasant Abd: Soft, NT/ND, +BS, no HSM   Lab Results:   Recent Labs  06/22/14 0520 06/23/14 0650  WBC 15.2* 7.6  HGB 9.3* 9.6*  HCT 28.5* 29.5*  PLT 143* 145*   BMET  Recent Labs  06/22/14 0520 06/23/14 0650  NA 139 138  K 3.6 3.3*  CL 113* 114*  CO2 17* 20  GLUCOSE 71 98  BUN 34* 22  CREATININE 3.05* 1.64*  CALCIUM 7.3* 8.1*   PT/INR No results for input(s): LABPROT, INR in the last 72 hours. CMP     Component Value Date/Time   NA 138 06/23/2014 0650   K 3.3* 06/23/2014 0650   CL 114* 06/23/2014 0650   CO2 20 06/23/2014 0650   GLUCOSE 98 06/23/2014 0650   BUN 22 06/23/2014 0650   CREATININE 1.64* 06/23/2014 0650   CALCIUM 8.1* 06/23/2014 0650   PROT 5.2* 06/22/2014 0520   ALBUMIN 2.0* 06/22/2014 0520   AST 71* 06/22/2014 0520   ALT 133* 06/22/2014 0520   ALKPHOS 142* 06/22/2014 0520   BILITOT 2.3* 06/22/2014 0520   GFRNONAA 30* 06/23/2014 0650   GFRAA 35* 06/23/2014 0650   Lipase     Component Value Date/Time   LIPASE 31 06/22/2014 0520       Studies/Results: No results found.  Anti-infectives: Anti-infectives     Start     Dose/Rate Route Frequency Ordered Stop   06/23/14 1130  ciprofloxacin (CIPRO) IVPB 400 mg     400 mg200 mL/hr over 60 Minutes Intravenous Every 12 hours 06/23/14 1058     06/21/14 1800  metroNIDAZOLE (FLAGYL) IVPB 500 mg     500 mg100 mL/hr over 60 Minutes Intravenous Every 8 hours 06/21/14 1630     06/21/14 1700  ciprofloxacin (CIPRO) IVPB 400 mg  Status:  Discontinued     400 mg200 mL/hr over 60 Minutes Intravenous Every 24 hours 06/21/14 1643 06/23/14 1058   06/21/14 1230  piperacillin-tazobactam (ZOSYN) IVPB 3.375 g     3.375 g100 mL/hr over 30 Minutes Intravenous  Once 06/21/14 1223 06/21/14 1326       Assessment/Plan Gallstone pancreatitis Sepsis with GNR bacteremia and elevated lactic acid. This has now cleared. Acute kidney injury - Likely secondary to sepsis and transient shock state. Improving. Would like to see this normalized prior to cholecystectomy History craniotomy for tumor Hypokalemia   Plan: 1.  Improving clinically, labs pending for today 2.  Suspect she passed a stone. Cannot rule out retained CBD stone and will need cholangiogram at time of cholecystectomy. 3.  Ultrasound only showed gallstones with mild gallbladder  wall thickening. CBD not dilated 4.  Continue antibiotics. 5.  Tolerating diet, NPO after MN.  Hold heparin after MN 6.  Patient is warming up to the idea of surgery.  She will discuss with her daughter today and decide about proceeding with surgery tomorrow if labs are okay.      LOS: 3 days    DORT, Jinny Blossom 06/24/2014, 7:30 AM Pager: [phone]

## 2014-06-24 NOTE — Progress Notes (Signed)
[facility] PROGRESS NOTE  Chelsea Walker YYT:[id number] DOB: [birth year] DOA: 06/21/2014 PCP: Pcp Not In System  Assessment/Plan: 1. Gallstone pancreatitis -Clinical improvement over the last 24 hours. Lipase trending down to 29 -Liver enzymes normalizing -Patient tolerating by mouth intake -Gen. surgery recommending cholecystectomy, patient agreed to undergo procedure  2.  Severe Sepsis -Present on admission, evidenced by development of acute renal failure, lactic acid of 3.49, white count of 22,300, hypotension, gram-negative rods in blood culture, with source of infection acute cholecystitis -Blood culture growing E.coli, organism pan-susceptible  -Currently on Cipro and flagyl, will consult ID for home AB therapy  3.  Acute kidney injury. -Secondary to sepsis and hypovolemia, initially presenting with a creatinine of 3.81 -Resolved  4.  Hypokalemia. -Resolved  Code Status: Full code Family Communication:  Disposition Plan: Cholecystectomy planned for AM   Consultants:  General surgery  GI  Antibiotics:  Ciprofloxacin  Flagyl  HPI/Subjective: Patient is a pleasant 73 year old female with a history of dyslipidemia and gastroesophageal reflux disease who presented as a transfer from [place], where she initially presented with complaints of abdominal pain associated with nausea and vomiting. She denied hematemesis, bloody stools, melena. She was found to be hypotensive for which she was initially transferred to the intensive care unit at [facility]. She had a CT scan of abdomen and pelvis performed on 06/21/2014 which showed evidence of cholelithiasis. There was high density material in the gallbladder consistent with stones and/or sludge. Labs revealed an elevated lipase of 578 and elevated liver enzymes with an AST of 196, ALT of 269, total bilirubin of 6.9. She was also found to be in acute renal failure having creatinine of 3.81. She was  started on empiric IV antimicrobial therapy with ciprofloxacin and Flagyl. Symptoms felt to be secondary to gallstone pancreatitis. General surgery and GI were consulted. After IV fluid resuscitation her creatinine trended down to 1.64 by 06/23/2014. Patient showed gradual clinical improvement and was transferred to 6N on 06/22/2014. GI did not feel that ERCP was needed as stone had likely been past. Diet was advanced. Gen. surgery recommending cholecystectomy.   Objective: Filed Vitals:   06/24/14 1352  BP: 128/82  Pulse: 87  Temp: 98.3 F (36.8 C)  Resp: 16    Intake/Output Summary (Last 24 hours) at 06/24/14 1748 Last data filed at 06/24/14 1400  Gross per 24 hour  Intake   3740 ml  Output      0 ml  Net   3740 ml   Filed Weights   06/21/14 1615  Weight: 86 kg (189 lb 9.5 oz)    Exam:   General:  Patient is in no acute distress she is sitting at bedside chair awake and alert tolerating her breakfast  Cardiovascular: Regular rate and rhythm normal S1-S2  Respiratory: Normal respiratory effort, lungs are clear  Abdomen: Soft nontender nondistended  Musculoskeletal: No edema  Data Reviewed: Basic Metabolic Panel:  Recent Labs Lab 06/21/14 1140 06/22/14 0520 06/23/14 0650 06/24/14 0830  NA 137 139 138 140  K 3.9 3.6 3.3* 4.2  CL 102 113* 114* 116*  CO2 21 17* 20 21  GLUCOSE 121* 71 98 112*  BUN 37* 34* 22 13  CREATININE 3.81* 3.05* 1.64* 1.07  CALCIUM 8.9 7.3* 8.1* 8.3*   Liver Function Tests:  Recent Labs Lab 06/21/14 1140 06/22/14 0520 06/24/14 0830  AST 196* 71* 27  ALT 269* 133* 71*  ALKPHOS 226* 142* 138*  BILITOT 6.9* 2.3* 1.7*  PROT 7.6 5.2* 6.0  ALBUMIN 3.5 2.0* 2.4*    Recent Labs Lab 06/21/14 1140 06/22/14 0520 06/24/14 0830  LIPASE 578* 31 29   No results for input(s): AMMONIA in the last 168 hours. CBC:  Recent Labs Lab 06/21/14 1140 06/22/14 0520 06/23/14 0650 06/24/14 0830  WBC 22.3* 15.2* 7.6 7.1  NEUTROABS 20.3*  --    --   --   HGB 12.2 9.3* 9.6* 9.9*  HCT 36.6 28.5* 29.5* 29.7*  MCV 94.8 93.4 91.9 92.8  PLT 176 143* 145* 144*   Cardiac Enzymes:  Recent Labs Lab 06/21/14 1140  TROPONINI 0.03   BNP (last 3 results) No results for input(s): PROBNP in the last 8760 hours. CBG:  Recent Labs Lab 06/21/14 1132 06/21/14 1613  GLUCAP 100* 73    Recent Results (from the past 240 hour(s))  Blood culture (routine x 2)     Status: None   Collection Time: 06/21/14 11:40 AM  Result Value Ref Range Status   Specimen Description BLOOD RIGHT ARM  Final   Special Requests BOTTLES DRAWN AEROBIC AND ANAEROBIC 5CC EACH  Final   Culture   Final    ESCHERICHIA COLI Note: Gram Stain Report Called to,Read Back By and Verified With: MELODIE WRIGHT 06/22/14 AT [place] Performed at [facility]    Report Status 06/24/2014 FINAL  Final   Organism ID, Bacteria ESCHERICHIA COLI  Final      Susceptibility   Escherichia coli - MIC*    AMPICILLIN 4 SENSITIVE Sensitive     AMPICILLIN/SULBACTAM 4 SENSITIVE Sensitive     CEFAZOLIN <=4 SENSITIVE Sensitive     CEFEPIME <=1 SENSITIVE Sensitive     CEFTAZIDIME <=1 SENSITIVE Sensitive     CEFTRIAXONE <=1 SENSITIVE Sensitive     CIPROFLOXACIN <=0.25 SENSITIVE Sensitive     GENTAMICIN <=1 SENSITIVE Sensitive     IMIPENEM <=0.25 SENSITIVE Sensitive     PIP/TAZO <=4 SENSITIVE Sensitive     TOBRAMYCIN <=1 SENSITIVE Sensitive     TRIMETH/SULFA >=320 RESISTANT Resistant     * ESCHERICHIA COLI  Blood culture (routine x 2)     Status: None (Preliminary result)   Collection Time: 06/21/14 12:15 PM  Result Value Ref Range Status   Specimen Description BLOOD LEFT HAND  Final   Special Requests BOTTLES DRAWN AEROBIC ONLY 2CC  Final   Culture   Final           BLOOD CULTURE RECEIVED NO GROWTH TO DATE CULTURE WILL BE HELD FOR 5 DAYS BEFORE ISSUING A FINAL NEGATIVE REPORT Performed at [facility]    Report Status PENDING  Incomplete  Urine culture      Status: None   Collection Time: 06/21/14 12:20 PM  Result Value Ref Range Status   Specimen Description URINE, CATHETERIZED  Final   Special Requests NONE  Final   Colony Count NO GROWTH Performed at [facility]   Final   Culture NO GROWTH Performed at [facility]   Final   Report Status 06/22/2014 FINAL  Final  MRSA PCR Screening     Status: None   Collection Time: 06/21/14  5:34 PM  Result Value Ref Range Status   MRSA by PCR NEGATIVE NEGATIVE Final    Comment:        The GeneXpert MRSA Assay (FDA approved for NASAL specimens only), is one component of a comprehensive MRSA colonization surveillance program. It is not intended to diagnose MRSA infection nor to  guide or monitor treatment for MRSA infections.   Surgical pcr screen     Status: Abnormal   Collection Time: 06/24/14 11:27 AM  Result Value Ref Range Status   MRSA, PCR NEGATIVE NEGATIVE Final   Staphylococcus aureus POSITIVE (A) NEGATIVE Final    Comment:        The Xpert SA Assay (FDA approved for NASAL specimens in patients over 74 years of age), is one component of a comprehensive surveillance program.  Test performance has been validated by [employer] for patients greater than or equal to 74 year old. It is not intended to diagnose infection nor to guide or monitor treatment.      Studies: No results found.  Scheduled Meds: . Chlorhexidine Gluconate Cloth  6 each Topical Daily  . ciprofloxacin  400 mg Intravenous Q12H  . heparin subcutaneous  5,000 Units Subcutaneous 3 times per day  . metronidazole  500 mg Intravenous Q8H  . mupirocin ointment  1 application Nasal BID  . ondansetron (ZOFRAN) IV  4 mg Intravenous Once  . pantoprazole  40 mg Oral Daily   Continuous Infusions: . sodium chloride 100 mL/hr at 06/24/14 1511    Active Problems:   Cholangitis   Gallstone pancreatitis   Sepsis   AKI (acute kidney injury)   Abdominal pain    Time spent: 25  minutes     Kelvin Cellar  [facility] Pager [phone]. If 7PM-7AM, please contact night-coverage at [email or web address], password [facility] 06/24/2014, 5:48 PM  LOS: 3 days

## 2014-06-25 ENCOUNTER — Encounter (HOSPITAL_COMMUNITY): Payer: Self-pay | Admitting: Certified Registered"

## 2014-06-25 ENCOUNTER — Encounter (HOSPITAL_COMMUNITY)
Admission: EM | Disposition: A | Discharge status: Home / Self Care | Payer: Self-pay | Source: Home / Self Care | Attending: Internal Medicine

## 2014-06-25 ENCOUNTER — Inpatient Hospital Stay (HOSPITAL_COMMUNITY): Payer: Medicare Other | Admitting: Anesthesiology

## 2014-06-25 DIAGNOSIS — R1011 Right upper quadrant pain: Secondary | ICD-10-CM

## 2014-06-25 HISTORY — PX: CHOLECYSTECTOMY: SHX55

## 2014-06-25 HISTORY — PX: LAPAROSCOPIC LYSIS OF ADHESIONS: SHX5905

## 2014-06-25 LAB — COMPREHENSIVE METABOLIC PANEL
ALT: 48 U/L — ABNORMAL HIGH (ref 0–35)
AST: 23 U/L (ref 0–37)
Albumin: 2.1 g/dL — ABNORMAL LOW (ref 3.5–5.2)
Alkaline Phosphatase: 110 U/L (ref 39–117)
Anion gap: 4 — ABNORMAL LOW (ref 5–15)
BILIRUBIN TOTAL: 1.1 mg/dL (ref 0.3–1.2)
BUN: 10 mg/dL (ref 6–23)
CO2: 19 mmol/L (ref 19–32)
CREATININE: 0.93 mg/dL (ref 0.50–1.10)
Calcium: 7.1 mg/dL — ABNORMAL LOW (ref 8.4–10.5)
Chloride: 116 mEq/L — ABNORMAL HIGH (ref 96–112)
GFR calc Af Amer: 69 mL/min — ABNORMAL LOW (ref >90)
GFR, EST NON AFRICAN AMERICAN: 60 mL/min — AB (ref >90)
GLUCOSE: 82 mg/dL (ref 70–99)
Potassium: 3.1 mmol/L — ABNORMAL LOW (ref 3.5–5.1)
Sodium: 139 mmol/L (ref 135–145)
Total Protein: 5 g/dL — ABNORMAL LOW (ref 6.0–8.3)

## 2014-06-25 LAB — LIPASE, BLOOD: LIPASE: 25 U/L (ref 11–59)

## 2014-06-25 SURGERY — LAPAROSCOPIC CHOLECYSTECTOMY WITH INTRAOPERATIVE CHOLANGIOGRAM
Anesthesia: General | Site: Abdomen

## 2014-06-25 MED ORDER — ONDANSETRON HCL 4 MG/2ML IJ SOLN
INTRAMUSCULAR | Status: DC | PRN
  Administered 2014-06-25: 4 mg via INTRAVENOUS

## 2014-06-25 MED ORDER — ESMOLOL HCL 10 MG/ML IV SOLN
INTRAVENOUS | Status: DC | PRN
  Administered 2014-06-25: 10 mg via INTRAVENOUS

## 2014-06-25 MED ORDER — FENTANYL CITRATE 0.05 MG/ML IJ SOLN
INTRAMUSCULAR | Status: AC
  Filled 2014-06-25: qty 2

## 2014-06-25 MED ORDER — MIDAZOLAM HCL 2 MG/2ML IJ SOLN
INTRAMUSCULAR | Status: AC
  Filled 2014-06-25: qty 2

## 2014-06-25 MED ORDER — POTASSIUM CHLORIDE CRYS ER 20 MEQ PO TBCR
40.0000 meq | EXTENDED_RELEASE_TABLET | Freq: Four times a day (QID) | ORAL | Status: AC
  Administered 2014-06-25 (×2): 40 meq via ORAL
  Filled 2014-06-25 (×2): qty 2

## 2014-06-25 MED ORDER — DEXAMETHASONE SODIUM PHOSPHATE 4 MG/ML IJ SOLN
INTRAMUSCULAR | Status: DC | PRN
  Administered 2014-06-25: 8 mg via INTRAVENOUS

## 2014-06-25 MED ORDER — FENTANYL CITRATE 0.05 MG/ML IJ SOLN
INTRAMUSCULAR | Status: AC
  Filled 2014-06-25: qty 5

## 2014-06-25 MED ORDER — GUAIFENESIN-DM 100-10 MG/5ML PO SYRP
5.0000 mL | ORAL_SOLUTION | ORAL | Status: DC | PRN
  Administered 2014-06-25 (×2): 5 mL via ORAL
  Filled 2014-06-25 (×2): qty 5

## 2014-06-25 MED ORDER — NEOSTIGMINE METHYLSULFATE 10 MG/10ML IV SOLN
INTRAVENOUS | Status: DC | PRN
  Administered 2014-06-25: 3 mg via INTRAVENOUS

## 2014-06-25 MED ORDER — FENTANYL CITRATE 0.05 MG/ML IJ SOLN
INTRAMUSCULAR | Status: DC | PRN
  Administered 2014-06-25: 50 ug via INTRAVENOUS
  Administered 2014-06-25: 100 ug via INTRAVENOUS
  Administered 2014-06-25 (×2): 50 ug via INTRAVENOUS

## 2014-06-25 MED ORDER — VECURONIUM BROMIDE 10 MG IV SOLR
INTRAVENOUS | Status: DC | PRN
  Administered 2014-06-25: 1 mg via INTRAVENOUS

## 2014-06-25 MED ORDER — PHENYLEPHRINE HCL 10 MG/ML IJ SOLN
INTRAMUSCULAR | Status: DC | PRN
  Administered 2014-06-25: 80 ug via INTRAVENOUS
  Administered 2014-06-25: 120 ug via INTRAVENOUS

## 2014-06-25 MED ORDER — ROCURONIUM BROMIDE 50 MG/5ML IV SOLN
INTRAVENOUS | Status: AC
Start: 2014-06-25 — End: 2014-06-25
  Filled 2014-06-25: qty 1

## 2014-06-25 MED ORDER — GLYCOPYRROLATE 0.2 MG/ML IJ SOLN
INTRAMUSCULAR | Status: AC
  Filled 2014-06-25: qty 2

## 2014-06-25 MED ORDER — MEPERIDINE HCL 25 MG/ML IJ SOLN: 6.2500 mg | INTRAMUSCULAR | Status: DC | PRN

## 2014-06-25 MED ORDER — OXYCODONE HCL 5 MG PO TABS: 5.0000 mg | ORAL_TABLET | Freq: Four times a day (QID) | ORAL | Status: DC | PRN

## 2014-06-25 MED ORDER — PROPOFOL 10 MG/ML IV BOLUS
INTRAVENOUS | Status: AC
  Filled 2014-06-25: qty 20

## 2014-06-25 MED ORDER — LIDOCAINE HCL (CARDIAC) 20 MG/ML IV SOLN
INTRAVENOUS | Status: DC | PRN
  Administered 2014-06-25: 100 mg via INTRAVENOUS

## 2014-06-25 MED ORDER — SODIUM CHLORIDE 0.9 % IR SOLN
Status: DC | PRN
  Administered 2014-06-25: 1

## 2014-06-25 MED ORDER — FENTANYL CITRATE 0.05 MG/ML IJ SOLN
25.0000 ug | INTRAMUSCULAR | Status: DC | PRN
  Administered 2014-06-25 (×4): 25 ug via INTRAVENOUS

## 2014-06-25 MED ORDER — ONDANSETRON HCL 4 MG/2ML IJ SOLN
INTRAMUSCULAR | Status: AC
  Filled 2014-06-25: qty 2

## 2014-06-25 MED ORDER — NEOSTIGMINE METHYLSULFATE 10 MG/10ML IV SOLN
INTRAVENOUS | Status: AC
Start: 2014-06-25 — End: 2014-06-25
  Filled 2014-06-25: qty 1

## 2014-06-25 MED ORDER — LACTATED RINGERS IV SOLN
INTRAVENOUS | Status: DC
  Administered 2014-06-25 (×3): via INTRAVENOUS

## 2014-06-25 MED ORDER — PHENYLEPHRINE 40 MCG/ML (10ML) SYRINGE FOR IV PUSH (FOR BLOOD PRESSURE SUPPORT)
PREFILLED_SYRINGE | INTRAVENOUS | Status: AC
  Filled 2014-06-25: qty 10

## 2014-06-25 MED ORDER — ROCURONIUM BROMIDE 100 MG/10ML IV SOLN
INTRAVENOUS | Status: DC | PRN
  Administered 2014-06-25: 10 mg via INTRAVENOUS
  Administered 2014-06-25: 40 mg via INTRAVENOUS

## 2014-06-25 MED ORDER — BUPIVACAINE-EPINEPHRINE 0.25% -1:200000 IJ SOLN
INTRAMUSCULAR | Status: DC | PRN
  Administered 2014-06-25: 28 mL

## 2014-06-25 MED ORDER — BUPIVACAINE-EPINEPHRINE (PF) 0.25% -1:200000 IJ SOLN
INTRAMUSCULAR | Status: AC
Start: 2014-06-25 — End: 2014-06-25
  Filled 2014-06-25: qty 30

## 2014-06-25 MED ORDER — PROMETHAZINE HCL 25 MG/ML IJ SOLN: 6.2500 mg | INTRAMUSCULAR | Status: DC | PRN

## 2014-06-25 MED ORDER — PROPOFOL 10 MG/ML IV BOLUS
INTRAVENOUS | Status: DC | PRN
  Administered 2014-06-25: 130 mg via INTRAVENOUS

## 2014-06-25 MED ORDER — 0.9 % SODIUM CHLORIDE (POUR BTL) OPTIME
TOPICAL | Status: DC | PRN
  Administered 2014-06-25: 1000 mL

## 2014-06-25 MED ORDER — LIDOCAINE HCL (CARDIAC) 20 MG/ML IV SOLN
INTRAVENOUS | Status: AC
  Filled 2014-06-25: qty 5

## 2014-06-25 MED ORDER — GLYCOPYRROLATE 0.2 MG/ML IJ SOLN
INTRAMUSCULAR | Status: DC | PRN
  Administered 2014-06-25: 0.4 mg via INTRAVENOUS

## 2014-06-25 SURGICAL SUPPLY — 42 items
APPLIER CLIP 5 13 M/L LIGAMAX5 (MISCELLANEOUS) ×3
BLADE SURG ROTATE 9660 (MISCELLANEOUS) IMPLANT
CANISTER SUCTION 2500CC (MISCELLANEOUS) ×3 IMPLANT
CHLORAPREP W/TINT 26ML (MISCELLANEOUS) ×3 IMPLANT
CLIP APPLIE 5 13 M/L LIGAMAX5 (MISCELLANEOUS) ×1 IMPLANT
COVER MAYO STAND STRL (DRAPES) ×3 IMPLANT
COVER SURGICAL LIGHT HANDLE (MISCELLANEOUS) ×3 IMPLANT
DRAPE C-ARM 42X72 X-RAY (DRAPES) ×3 IMPLANT
DRAPE LAPAROSCOPIC ABDOMINAL (DRAPES) ×3 IMPLANT
ELECT REM PT RETURN 9FT ADLT (ELECTROSURGICAL) ×3
ELECTRODE REM PT RTRN 9FT ADLT (ELECTROSURGICAL) ×1 IMPLANT
FILTER SMOKE EVAC LAPAROSHD (FILTER) IMPLANT
GLOVE BIO SURGEON STRL SZ8 (GLOVE) ×3 IMPLANT
GLOVE BIOGEL PI IND STRL 7.0 (GLOVE) ×2 IMPLANT
GLOVE BIOGEL PI IND STRL 8 (GLOVE) ×1 IMPLANT
GLOVE BIOGEL PI INDICATOR 7.0 (GLOVE) ×4
GLOVE BIOGEL PI INDICATOR 8 (GLOVE) ×2
GLOVE SURG SS PI 7.0 STRL IVOR (GLOVE) ×6 IMPLANT
GOWN STRL REUS W/ TWL LRG LVL3 (GOWN DISPOSABLE) ×2 IMPLANT
GOWN STRL REUS W/ TWL XL LVL3 (GOWN DISPOSABLE) ×1 IMPLANT
GOWN STRL REUS W/TWL LRG LVL3 (GOWN DISPOSABLE) ×4
GOWN STRL REUS W/TWL XL LVL3 (GOWN DISPOSABLE) ×2
KIT BASIN OR (CUSTOM PROCEDURE TRAY) ×3 IMPLANT
KIT ROOM TURNOVER OR (KITS) ×3 IMPLANT
LIQUID BAND (GAUZE/BANDAGES/DRESSINGS) ×3 IMPLANT
NS IRRIG 1000ML POUR BTL (IV SOLUTION) ×3 IMPLANT
PAD ARMBOARD 7.5X6 YLW CONV (MISCELLANEOUS) ×3 IMPLANT
POUCH RETRIEVAL ECOSAC 10 (ENDOMECHANICALS) ×1 IMPLANT
POUCH RETRIEVAL ECOSAC 10MM (ENDOMECHANICALS) ×2
SCISSORS LAP 5X35 DISP (ENDOMECHANICALS) ×3 IMPLANT
SET CHOLANGIOGRAPH 5 50 .035 (SET/KITS/TRAYS/PACK) ×3 IMPLANT
SET IRRIG TUBING LAPAROSCOPIC (IRRIGATION / IRRIGATOR) ×3 IMPLANT
SLEEVE ENDOPATH XCEL 5M (ENDOMECHANICALS) ×6 IMPLANT
SPECIMEN JAR SMALL (MISCELLANEOUS) ×3 IMPLANT
SUT VIC AB 4-0 PS2 27 (SUTURE) ×3 IMPLANT
TOWEL OR 17X24 6PK STRL BLUE (TOWEL DISPOSABLE) ×3 IMPLANT
TOWEL OR 17X26 10 PK STRL BLUE (TOWEL DISPOSABLE) ×3 IMPLANT
TRAY LAPAROSCOPIC (CUSTOM PROCEDURE TRAY) ×3 IMPLANT
TROCAR BLADELESS 12MM (ENDOMECHANICALS) ×3 IMPLANT
TROCAR XCEL BLUNT TIP 100MML (ENDOMECHANICALS) ×3 IMPLANT
TROCAR XCEL NON-BLD 5MMX100MML (ENDOMECHANICALS) ×3 IMPLANT
TUBING INSUFFLATION (TUBING) ×3 IMPLANT

## 2014-06-25 NOTE — Op Note (Signed)
06/21/2014 - 06/25/2014  1:21 PM  PATIENT:  Chelsea Walker  73 y.o. female  PRE-OPERATIVE DIAGNOSIS:  Cholecystitis  POST-OPERATIVE DIAGNOSIS:  Cholecystitis  PROCEDURE:  Procedure(s): LAPAROSCOPIC CHOLECYSTECTOMY LAPAROSCOPIC LYSIS OF ADHESIONS, 15 MINUTES  SURGEON:  Surgeon(s): Georganna Skeans, MD  ASSISTANTS: none   ANESTHESIA:   local and general  EBL:  Total I/O In: 1000 [I.V.:1000] Out: 550 [Urine:500; Blood:50]  BLOOD ADMINISTERED:none  DRAINS: none   SPECIMEN:  Excision  DISPOSITION OF SPECIMEN:  PATHOLOGY  COUNTS:  YES  DICTATION: Viviann Spare Dictation  patient presents for cholecystectomy. She was identified in the preop holding area. Informed consent was obtained. She received intravenous antibiotics. She was brought to the operating room and general endotracheal anesthesia was administered by the anesthesia staff. Her abdomen was prepped and draped in a sterile fashion. Time out procedure was done. Infra-umbilical region was infiltrated with local. Infra-umbilical incision was made. Subcutaneous tissues were dissected down revealing the anterior fascia. This was divided sharply along the midline. I gradually entered the abdomen carefully under direct vision. There were a lot of adhesions from previous hysterectomy. I was able to insert the [facility] trocar, however the adhesions precluded much manipulation. The abdomen, however, was able to be insufflated safely. I then placed an Optiview right upper quadrant port along the rib margin without complication. This allowed visualization of the midline adhesions. A second right upper quadrant 5 mm port was placed under direct vision. Local was used at all port sites. This facilitated lysis of adhesions along the midline. This was carefully done and took 15 minutes to expose the periumbilical region safely. There was some bowel in the region but not densely adherent to the abdominal wall. No bowel injuries occurred. The Sheryle Hail was  still behind some other more dense adhesions. Decision was made it would be safest to place an 11 mm port more cephalad and to the right for our camera site. This was done under direct vision. The Sheryle Hail was removed and the fascia closed by tying the pursestring at that location. Next, a 5 mm epigastric port was placed under direct vision. This allowed the dome of the gallbladder to be retracted superior medially. The infundibulum was covered with some dense omental adhesions which were gradually swept away. The infundibulum was then retracted inferior laterally. Dissection began laterally and progressed medially easily identifying the cystic duct. A critical view was obtained between the cystic duct, the liver, and the infundibulum of the gallbladder. Clip was placed on the infundibular cystic duct junction to attempt cholangiogram. Small nick was made in the cystic duct. Cystic duct was milked and no stones appeared to be present. After multiple attempts, however I was not able to pass the [facility] so cholangiogram was abandoned. 3 clips were placed proximally on the cystic duct and it was divided. Anterior branch of the cystic artery was identified, dissected, clipped twice proximally once distally and divided. Gallbladder was taken off the liver bed using cautery. Posterior branch of the cystic artery was also identified, clipped twice proximally, divided distally with cautery. Gallbladder was removed the rest of the way and placed in an eco sac and removed from the abdomen via the 11 mm port site. Liver bed was copiously irrigated and hemostasis was obtained. Abdomen was copiously irrigated and irrigation returned clear. Inspection of the area of adhesiolysis revealed no common gating features. Ports were removed under direct vision, pneumoperitoneum was released, all 5 wounds were copiously irrigated. The fascia of the 11 mm port site  was closed with a 0 Vicryl. All wounds were then irrigated and  closed with running 4-0 Vicryl subcuticular stitch followed by Dermabond. All counts were correct. Patient tolerated procedure well without apparent complication and was taken recovery in stable condition.  PATIENT DISPOSITION:  PACU - hemodynamically stable.   Delay start of Pharmacological VTE agent (>24hrs) due to surgical blood loss or risk of bleeding:  no  Georganna Skeans, MD, MPH, FACS Pager: [phone]  12/29/20151:21 PM

## 2014-06-25 NOTE — Anesthesia Postprocedure Evaluation (Signed)
  Anesthesia Post-op Note  Patient: Chelsea Walker  Procedure(s) Performed: Procedure(s): LAPAROSCOPIC CHOLECYSTECTOMY (N/A) LAPAROSCOPIC LYSIS OF ADHESIONS, 15 MINUTES (N/A)  Patient Location: PACU  Anesthesia Type:General  Level of Consciousness: awake and alert   Airway and Oxygen Therapy: Patient Spontanous Breathing and Patient connected to nasal cannula oxygen  Post-op Pain: mild  Post-op Assessment: Post-op Vital signs reviewed, Patient's Cardiovascular Status Stable, Respiratory Function Stable and Patent Airway  Post-op Vital Signs: Reviewed and stable  Last Vitals:  Filed Vitals:   06/25/14 1326  BP:   Pulse:   Temp: 36.4 C  Resp:     Complications: No apparent anesthesia complications

## 2014-06-25 NOTE — Anesthesia Procedure Notes (Signed)
Procedure Name: Intubation Date/Time: 06/25/2014 11:53 AM Performed by: Julian Reil Pre-anesthesia Checklist: Patient identified, Emergency Drugs available, Suction available and Patient being monitored Patient Re-evaluated:Patient Re-evaluated prior to inductionOxygen Delivery Method: Circle system utilized Preoxygenation: Pre-oxygenation with 100% oxygen Intubation Type: IV induction Ventilation: Mask ventilation without difficulty Laryngoscope Size: Mac and 3 Grade View: Grade I Tube type: Oral Tube size: 7.0 mm Number of attempts: 1 Airway Equipment and Method: Stylet Placement Confirmation: ETT inserted through vocal cords under direct vision,  positive ETCO2 and breath sounds checked- equal and bilateral Secured at: 21 cm Tube secured with: Tape Dental Injury: Teeth and Oropharynx as per pre-operative assessment

## 2014-06-25 NOTE — Progress Notes (Signed)
[facility] PROGRESS NOTE  Chelsea Walker [id number]:[id number] DOB: 12-17-1940 DOA: 06/21/2014 PCP: Pcp Not In System  Interim Summary Patient is a pleasant 73 year old female with a history of dyslipidemia and gastroesophageal reflux disease who presented as a transfer from [place], where she initially presented with complaints of abdominal pain associated with nausea and vomiting. She denied hematemesis, bloody stools, melena. She was found to be hypotensive for which she was initially transferred to the intensive care unit at [facility]. She had a CT scan of abdomen and pelvis performed on 06/21/2014 which showed evidence of cholelithiasis. There was high density material in the gallbladder consistent with stones and/or sludge. Labs revealed an elevated lipase of 578 and elevated liver enzymes with an AST of 196, ALT of 269, total bilirubin of 6.9. She was also found to be in acute renal failure having creatinine of 3.81. She was started on empiric IV antimicrobial therapy with ciprofloxacin and Flagyl. Symptoms felt to be secondary to gallstone pancreatitis. General surgery and GI were consulted. After IV fluid resuscitation her creatinine trended down to 1.64 by 06/23/2014. Patient showed gradual clinical improvement and was transferred to 6N on 06/22/2014. GI did not feel that ERCP was needed as stone had likely been past. Diet was advanced. Patient taken to the OR on 06/25/2014 where she underwent laparoscopic cholecystectomy, tolerated procedure well. With regard to positive blood cultures I spoke with Dr. Graylon Good of infectious disease who recommended 10 more days of oral antimicrobial therapy.   Assessment/Plan: 1. Gallstone pancreatitis -Clinical improvement over the last 24 hours. Lipase trending down to 29 -Liver enzymes normalizing -Pt was taken to the OR on 06/25/2014 undergoing laparoscopic cholecystectomy, tolerated procedure well, there were no  complications -Anticipate discharge in the next 24 hours if she remains stable. Will need to be discharged on 10 days of oral antimicrobial therapy given positive blood cultures.   2.  Severe Sepsis -Present on admission, evidenced by development of acute renal failure, lactic acid of 3.49, white count of 22,300, hypotension, gram-negative rods in blood culture, with source of infection acute cholecystitis -Blood culture growing E.coli, organism pan-susceptible  -Currently on Cipro and flagyl -I spoke to Dr Graylon Good of infectious disease who recommended discharging her on 10 days of oral antimicrobial therapy.   3.  Acute kidney injury. -Secondary to sepsis and hypovolemia, initially presenting with a creatinine of 3.81 -Resolved  4.  Hypokalemia. -Resolved  Code Status: Full code Family Communication: I spoke with her daughter was present at bedside Disposition Plan: Anticipate discharge in the next 24 hours if remains stable and cleared by surgery   Consultants:  General surgery  GI  Antibiotics:  Ciprofloxacin  Flagyl  HPI/Subjective: Patient being seen postop, she reports mild to moderate abdominal pain over incision sites, denies nausea vomiting fevers or chills, does complain of some cough.  Objective: Filed Vitals:   06/25/14 1442  BP: 117/81  Pulse: 66  Temp: 97.8 F (36.6 C)  Resp: 17    Intake/Output Summary (Last 24 hours) at 06/25/14 1649 Last data filed at 06/25/14 1523  Gross per 24 hour  Intake   1400 ml  Output   2650 ml  Net  -1250 ml   Filed Weights   06/21/14 1615  Weight: 86 kg (189 lb 9.5 oz)    Exam:   General:  Patient is in no acute distress hemodynamically stable, awake and alert oriented  Cardiovascular: Regular rate and rhythm normal S1-S2  Respiratory: Normal respiratory  effort, lungs are clear  Abdomen: Soft, having some tenderness over surgical incision sites  Musculoskeletal: No edema  Data Reviewed: Basic Metabolic  Panel:  Recent Labs Lab 06/21/14 1140 06/22/14 0520 06/23/14 0650 06/24/14 0830 06/25/14 0456  NA 137 139 138 140 139  K 3.9 3.6 3.3* 4.2 3.1*  CL 102 113* 114* 116* 116*  CO2 21 17* 20 21 19   GLUCOSE 121* 71 98 112* 82  BUN 37* 34* 22 13 10   CREATININE 3.81* 3.05* 1.64* 1.07 0.93  CALCIUM 8.9 7.3* 8.1* 8.3* 7.1*   Liver Function Tests:  Recent Labs Lab 06/21/14 1140 06/22/14 0520 06/24/14 0830 06/25/14 0456  AST 196* 71* 27 23  ALT 269* 133* 71* 48*  ALKPHOS 226* 142* 138* 110  BILITOT 6.9* 2.3* 1.7* 1.1  PROT 7.6 5.2* 6.0 5.0*  ALBUMIN 3.5 2.0* 2.4* 2.1*    Recent Labs Lab 06/21/14 1140 06/22/14 0520 06/24/14 0830 06/25/14 0456  LIPASE 578* 31 29 25    No results for input(s): AMMONIA in the last 168 hours. CBC:  Recent Labs Lab 06/21/14 1140 06/22/14 0520 06/23/14 0650 06/24/14 0830  WBC 22.3* 15.2* 7.6 7.1  NEUTROABS 20.3*  --   --   --   HGB 12.2 9.3* 9.6* 9.9*  HCT 36.6 28.5* 29.5* 29.7*  MCV 94.8 93.4 91.9 92.8  PLT 176 143* 145* 144*   Cardiac Enzymes:  Recent Labs Lab 06/21/14 1140  TROPONINI 0.03   BNP (last 3 results) No results for input(s): PROBNP in the last 8760 hours. CBG:  Recent Labs Lab 06/21/14 1132 06/21/14 1613  GLUCAP 100* 73    Recent Results (from the past 240 hour(s))  Blood culture (routine x 2)     Status: None   Collection Time: 06/21/14 11:40 AM  Result Value Ref Range Status   Specimen Description BLOOD RIGHT ARM  Final   Special Requests BOTTLES DRAWN AEROBIC AND ANAEROBIC 5CC EACH  Final   Culture   Final    ESCHERICHIA COLI Note: Gram Stain Report Called to,Read Back By and Verified With: MELODIE WRIGHT 06/22/14 AT [place] Performed at [facility]    Report Status 06/24/2014 FINAL  Final   Organism ID, Bacteria ESCHERICHIA COLI  Final      Susceptibility   Escherichia coli - MIC*    AMPICILLIN 4 SENSITIVE Sensitive     AMPICILLIN/SULBACTAM 4 SENSITIVE Sensitive     CEFAZOLIN <=4  SENSITIVE Sensitive     CEFEPIME <=1 SENSITIVE Sensitive     CEFTAZIDIME <=1 SENSITIVE Sensitive     CEFTRIAXONE <=1 SENSITIVE Sensitive     CIPROFLOXACIN <=0.25 SENSITIVE Sensitive     GENTAMICIN <=1 SENSITIVE Sensitive     IMIPENEM <=0.25 SENSITIVE Sensitive     PIP/TAZO <=4 SENSITIVE Sensitive     TOBRAMYCIN <=1 SENSITIVE Sensitive     TRIMETH/SULFA >=320 RESISTANT Resistant     * ESCHERICHIA COLI  Blood culture (routine x 2)     Status: None (Preliminary result)   Collection Time: 06/21/14 12:15 PM  Result Value Ref Range Status   Specimen Description BLOOD LEFT HAND  Final   Special Requests BOTTLES DRAWN AEROBIC ONLY 2CC  Final   Culture   Final           BLOOD CULTURE RECEIVED NO GROWTH TO DATE CULTURE WILL BE HELD FOR 5 DAYS BEFORE ISSUING A FINAL NEGATIVE REPORT Performed at [facility]    Report Status PENDING  Incomplete  Urine culture  Status: None   Collection Time: 06/21/14 12:20 PM  Result Value Ref Range Status   Specimen Description URINE, CATHETERIZED  Final   Special Requests NONE  Final   Colony Count NO GROWTH Performed at [facility]   Final   Culture NO GROWTH Performed at [facility]   Final   Report Status 06/22/2014 FINAL  Final  MRSA PCR Screening     Status: None   Collection Time: 06/21/14  5:34 PM  Result Value Ref Range Status   MRSA by PCR NEGATIVE NEGATIVE Final    Comment:        The GeneXpert MRSA Assay (FDA approved for NASAL specimens only), is one component of a comprehensive MRSA colonization surveillance program. It is not intended to diagnose MRSA infection nor to guide or monitor treatment for MRSA infections.   Surgical pcr screen     Status: Abnormal   Collection Time: 06/24/14 11:27 AM  Result Value Ref Range Status   MRSA, PCR NEGATIVE NEGATIVE Final   Staphylococcus aureus POSITIVE (A) NEGATIVE Final    Comment:        The Xpert SA Assay (FDA approved for NASAL specimens in patients  over 5 years of age), is one component of a comprehensive surveillance program.  Test performance has been validated by [employer] for patients greater than or equal to 66 year old. It is not intended to diagnose infection nor to guide or monitor treatment.      Studies: No results found.  Scheduled Meds: . Chlorhexidine Gluconate Cloth  6 each Topical Daily  . ciprofloxacin  400 mg Intravenous Q12H  . fentaNYL      . heparin subcutaneous  5,000 Units Subcutaneous 3 times per day  . metronidazole  500 mg Intravenous Q8H  . mupirocin ointment  1 application Nasal BID  . ondansetron (ZOFRAN) IV  4 mg Intravenous Once  . pantoprazole  40 mg Oral Daily  . potassium chloride  40 mEq Oral Q6H   Continuous Infusions: . sodium chloride 100 mL/hr at 06/25/14 1509  . lactated ringers 10 mL/hr at 06/25/14 1051    Active Problems:   Cholangitis   Gallstone pancreatitis   Sepsis   AKI (acute kidney injury)   Abdominal pain    Time spent: 25 minutes     Kelvin Cellar  [facility] Pager [phone]. If 7PM-7AM, please contact night-coverage at [email or web address], password [facility] 06/25/2014, 4:49 PM  LOS: 4 days

## 2014-06-25 NOTE — Progress Notes (Signed)
Day of Surgery  Subjective: Less pain  Objective: Vital signs in last 24 hours: Temp:  [98.2 F (36.8 C)-98.9 F (37.2 C)] 98.9 F (37.2 C) (12/29 0514) Pulse Rate:  [78-87] 78 (12/29 0514) Resp:  [16] 16 (12/29 0514) BP: (128-138)/(78-82) 138/79 mmHg (12/29 0514) SpO2:  [97 %-98 %] 98 % (12/29 0514) Last BM Date: 06/24/14  Intake/Output from previous day: 12/28 0701 - 12/29 0700 In: 2180 [P.O.:480; I.V.:700; IV Piggyback:1000] Out: 1650 [Urine:1650] Intake/Output this shift: Total I/O In: -  Out: 500 [Urine:500]  General appearance: alert and cooperative Resp: clear to auscultation bilaterally Cardio: S1, S2 normal GI: soft, NT  Lab Results:   Recent Labs  06/23/14 0650 06/24/14 0830  WBC 7.6 7.1  HGB 9.6* 9.9*  HCT 29.5* 29.7*  PLT 145* 144*   BMET  Recent Labs  06/24/14 0830 06/25/14 0456  NA 140 139  K 4.2 3.1*  CL 116* 116*  CO2 21 19  GLUCOSE 112* 82  BUN 13 10  CREATININE 1.07 0.93  CALCIUM 8.3* 7.1*   PT/INR No results for input(s): LABPROT, INR in the last 72 hours. ABG No results for input(s): PHART, HCO3 in the last 72 hours.  Invalid input(s): PCO2, PO2  Studies/Results: No results found.  Anti-infectives: Anti-infectives    Start     Dose/Rate Route Frequency Ordered Stop   06/23/14 1130  [MAR Hold]  ciprofloxacin (CIPRO) IVPB 400 mg     (MAR Hold since 06/25/14 1038)   400 mg200 mL/hr over 60 Minutes Intravenous Every 12 hours 06/23/14 1058     06/21/14 1800  [MAR Hold]  metroNIDAZOLE (FLAGYL) IVPB 500 mg     (MAR Hold since 06/25/14 1038)   500 mg100 mL/hr over 60 Minutes Intravenous Every 8 hours 06/21/14 1630     06/21/14 1700  ciprofloxacin (CIPRO) IVPB 400 mg  Status:  Discontinued     400 mg200 mL/hr over 60 Minutes Intravenous Every 24 hours 06/21/14 1643 06/23/14 1058   06/21/14 1230  piperacillin-tazobactam (ZOSYN) IVPB 3.375 g     3.375 g100 mL/hr over 30 Minutes Intravenous  Once 06/21/14 1223 06/21/14 1326       Assessment/Plan: s/p Procedure(s): LAPAROSCOPIC CHOLECYSTECTOMY WITH INTRAOPERATIVE CHOLANGIOGRAM (N/A) Cholecystitis - for lap chole IOC today. Procedure, risks, and benefits D/W her and she agrees.  LOS: 4 days    Chelsea Walker E 06/25/2014

## 2014-06-25 NOTE — Transfer of Care (Signed)
Immediate Anesthesia Transfer of Care Note  Patient: Launa Grill  Procedure(s) Performed: Procedure(s): LAPAROSCOPIC CHOLECYSTECTOMY (N/A) LAPAROSCOPIC LYSIS OF ADHESIONS, 15 MINUTES (N/A)  Patient Location: PACU  Anesthesia Type:General  Level of Consciousness: awake, alert , oriented and patient cooperative  Airway & Oxygen Therapy: Patient Spontanous Breathing and Patient connected to nasal cannula oxygen  Post-op Assessment: Report given to PACU RN, Post -op Vital signs reviewed and stable and Patient moving all extremities  Post vital signs: Reviewed and stable  Complications: No apparent anesthesia complications

## 2014-06-26 ENCOUNTER — Encounter (HOSPITAL_COMMUNITY): Payer: Self-pay | Admitting: General Surgery

## 2014-06-26 LAB — COMPREHENSIVE METABOLIC PANEL
ALBUMIN: 2.3 g/dL — AB (ref 3.5–5.2)
ALK PHOS: 108 U/L (ref 39–117)
ALT: 50 U/L — AB (ref 0–35)
ANION GAP: 8 (ref 5–15)
AST: 59 U/L — ABNORMAL HIGH (ref 0–37)
BUN: 9 mg/dL (ref 6–23)
CO2: 20 mmol/L (ref 19–32)
Calcium: 7.8 mg/dL — ABNORMAL LOW (ref 8.4–10.5)
Chloride: 112 mEq/L (ref 96–112)
Creatinine, Ser: 0.95 mg/dL (ref 0.50–1.10)
GFR calc Af Amer: 68 mL/min — ABNORMAL LOW (ref >90)
GFR calc non Af Amer: 58 mL/min — ABNORMAL LOW (ref >90)
Glucose, Bld: 92 mg/dL (ref 70–99)
POTASSIUM: 4.4 mmol/L (ref 3.5–5.1)
SODIUM: 140 mmol/L (ref 135–145)
Total Bilirubin: 1.1 mg/dL (ref 0.3–1.2)
Total Protein: 5.6 g/dL — ABNORMAL LOW (ref 6.0–8.3)

## 2014-06-26 LAB — CBC
HCT: 27 % — ABNORMAL LOW (ref 36.0–46.0)
HEMOGLOBIN: 8.8 g/dL — AB (ref 12.0–15.0)
MCH: 29.9 pg (ref 26.0–34.0)
MCHC: 32.6 g/dL (ref 30.0–36.0)
MCV: 91.8 fL (ref 78.0–100.0)
Platelets: 161 10*3/uL (ref 150–400)
RBC: 2.94 MIL/uL — ABNORMAL LOW (ref 3.87–5.11)
RDW: 14.3 % (ref 11.5–15.5)
WBC: 7.5 10*3/uL (ref 4.0–10.5)

## 2014-06-26 MED ORDER — OXYCODONE HCL 5 MG PO TABS: 5.0000 mg | ORAL_TABLET | Freq: Four times a day (QID) | ORAL | Status: AC | PRN

## 2014-06-26 MED ORDER — PRAVASTATIN SODIUM 20 MG PO TABS: 20.0000 mg | ORAL_TABLET | Freq: Every day | ORAL | Status: AC

## 2014-06-26 MED ORDER — FLUCONAZOLE 150 MG PO TABS
150.0000 mg | ORAL_TABLET | Freq: Once | ORAL | Status: AC
  Administered 2014-06-26: 150 mg via ORAL
  Filled 2014-06-26 (×2): qty 1

## 2014-06-26 MED ORDER — CIPROFLOXACIN HCL 500 MG PO TABS
500.0000 mg | ORAL_TABLET | Freq: Two times a day (BID) | ORAL | Status: AC
Start: 2014-06-26 — End: ?

## 2014-06-26 NOTE — Discharge Summary (Signed)
PATIENT DETAILS Name: Chelsea Walker Age: 73 y.o. Sex: female Date of Birth: [birth year] MRN: [id number]. Admitting Physician: [name], MD PCP:Pcp Not In System  Admit Date: 06/21/2014 Discharge date: 06/26/2014  Recommendations for Outpatient Follow-up:  1. Please repeat blood cultures once patient completes course of antibiotics 2. Please check CBC/chemistries in 1-2 weeks or at next follow-up with primary M.D. 3. Resume antihypertensives if able at next follow-up-currently off lisinopril. Blood pressure controlled without the use of any antihypertensives.  PRIMARY DISCHARGE DIAGNOSIS:  Active Problems:   Cholangitis   Gallstone pancreatitis   Sepsis   AKI (acute kidney injury)   Abdominal pain      PAST MEDICAL HISTORY: Past Medical History  Diagnosis Date  . Brain tumor   . Hypertension   . GERD (gastroesophageal reflux disease)   . High cholesterol     DISCHARGE MEDICATIONS: Current Discharge Medication List    START taking these medications   Details  ciprofloxacin (CIPRO) 500 MG tablet Take 1 tablet (500 mg total) by mouth 2 (two) times daily. Qty: 20 tablet, Refills: 0    oxyCODONE (OXY IR/ROXICODONE) 5 MG immediate release tablet Take 1-2 tablets (5-10 mg total) by mouth every 6 (six) hours as needed (Pain). Qty: 30 tablet, Refills: 0      CONTINUE these medications which have CHANGED   Details  pravastatin (PRAVACHOL) 20 MG tablet Take 1 tablet (20 mg total) by mouth daily.      CONTINUE these medications which have NOT CHANGED   Details  Fish Oil OIL Take 1 capsule by mouth daily.     omeprazole (PRILOSEC) 20 MG capsule Take 20 mg by mouth daily.      STOP taking these medications     lisinopril (PRINIVIL,ZESTRIL) 10 MG tablet         ALLERGIES:  No Known Allergies  BRIEF HPI:  See H&P, Labs, Consult and Test reports for all details in brief, patient patient is a 73 year old female with history of hypertension who presented to the  emergency room on 12/25 with nausea, vomiting and abdominal pain. She was initially hypotensive with elevated lactic acid in the emergency room. CT scan of the abdomen showed cholelithiasis, laboratory data was concerning for gallstone pancreatitis.  CONSULTATIONS:   pulmonary/intensive care, gastroenterology and general surgery  PERTINENT RADIOLOGIC STUDIES: Ct Abdomen Pelvis Wo Contrast  06/21/2014   CLINICAL DATA:  Vomiting with eating.  Back pain.  EXAM: CT ABDOMEN AND PELVIS WITHOUT CONTRAST  TECHNIQUE: Multidetector CT imaging of the abdomen and pelvis was performed following the standard protocol without IV contrast.  COMPARISON:  None.  FINDINGS: Linear densities at both lung bases are most compatible with atelectasis. Evidence for a small calcified granuloma at the left lung base. Negative for free intraperitoneal air.  There is high-density material in the gallbladder consistent with stones and/or sludge. Gallbladder is mildly distended. No gross abnormality to the liver, spleen or pancreas. Normal appearance of the adrenal glands and kidneys. Negative for kidney stones or hydronephrosis. Abdominal aorta measures up to 2.3 cm with atherosclerotic calcifications. No significant free fluid or lymphadenopathy. Uterus has been removed. Normal appearance of the small bowel, appendix and colon. Evidence for a small splenule inferior to the spleen. There is some colonic diverticulosis without acute inflammation. Two linear densities in the rectum are consistent with ingested material. Urinary bladder is decompressed.  There is grade 2 anterolisthesis at L5-S1. There may be a right-sided pars defect at L5. Multilevel disc disease in lumbar  spine. There is an umbilical hernia containing fat.  IMPRESSION: Cholelithiasis.  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   Electronically Signed   By: Markus Daft M.D.   On: 06/21/2014 13:28   Dg Chest 2 View  06/21/2014   CLINICAL  DATA:  Vomiting for 2 days  EXAM: CHEST  2 VIEW  COMPARISON:  None.  FINDINGS: Cardiomediastinal silhouette is unremarkable. No acute infiltrate or pleural effusion. Osteopenia and mild degenerative changes lower thoracic and lumbar spine. Mild compression deformity upper endplate of W43 vertebral body of indeterminate age.  IMPRESSION: No active disease. Mild degenerative changes thoracic spine. Mild compression deformity upper endplate of X54 vertebral body of indeterminate age.   Electronically Signed   By: Lahoma Crocker M.D.   On: 06/21/2014 13:48   US Abdomen Limited Ruq  06/21/2014   CLINICAL DATA:  Cholangitis  EXAM: US ABDOMEN LIMITED - RIGHT UPPER QUADRANT  COMPARISON:  None.  FINDINGS: Gallbladder:  Multiple small gallstones are present. Mild gallbladder wall thickening. No pericholecystic fluid. No Murphy sign.  Common bile duct:  Diameter: 6 mm in caliber  Liver:  No focal lesion identified. Within normal limits in parenchymal echogenicity.  IMPRESSION: Cholelithiasis.  Nonspecific mild gallbladder wall thickening. Correlate clinically as for the need for nuclear medicine imaging. Please note that the sonographic Percell Miller sign was negative.   Electronically Signed   By: Maryclare Bean M.D.   On: 06/21/2014 18:41     PERTINENT LAB RESULTS: CBC:  Recent Labs  06/24/14 0830 06/26/14 0530  WBC 7.1 7.5  HGB 9.9* 8.8*  HCT 29.7* 27.0*  PLT 144* 161   CMET CMP     Component Value Date/Time   NA 140 06/26/2014 0530   K 4.4 06/26/2014 0530   CL 112 06/26/2014 0530   CO2 20 06/26/2014 0530   GLUCOSE 92 06/26/2014 0530   BUN 9 06/26/2014 0530   CREATININE 0.95 06/26/2014 0530   CALCIUM 7.8* 06/26/2014 0530   PROT 5.6* 06/26/2014 0530   ALBUMIN 2.3* 06/26/2014 0530   AST 59* 06/26/2014 0530   ALT 50* 06/26/2014 0530   ALKPHOS 108 06/26/2014 0530   BILITOT 1.1 06/26/2014 0530   GFRNONAA 58* 06/26/2014 0530   GFRAA 68* 06/26/2014 0530    GFR Estimated Creatinine Clearance: 59.2 mL/min  (by C-G formula based on Cr of 0.95).  Recent Labs  06/24/14 0830 06/25/14 0456  LIPASE 29 25   No results for input(s): CKTOTAL, CKMB, CKMBINDEX, TROPONINI in the last 72 hours. Invalid input(s): POCBNP No results for input(s): DDIMER in the last 72 hours. No results for input(s): HGBA1C in the last 72 hours. No results for input(s): CHOL, HDL, LDLCALC, TRIG, CHOLHDL, LDLDIRECT in the last 72 hours. No results for input(s): TSH, T4TOTAL, T3FREE, THYROIDAB in the last 72 hours.  Invalid input(s): FREET3 No results for input(s): VITAMINB12, FOLATE, FERRITIN, TIBC, IRON, RETICCTPCT in the last 72 hours. Coags: No results for input(s): INR in the last 72 hours.  Invalid input(s): PT Microbiology: Recent Results (from the past 240 hour(s))  Blood culture (routine x 2)     Status: None   Collection Time: 06/21/14 11:40 AM  Result Value Ref Range Status   Specimen Description BLOOD RIGHT ARM  Final   Special Requests BOTTLES DRAWN AEROBIC AND ANAEROBIC 5CC EACH  Final   Culture   Final    ESCHERICHIA COLI Note: Gram Stain Report Called to,Read Back By and Verified With: MELODIE WRIGHT  06/22/14 AT 0720 RIDK Performed at [facility]    Report Status 06/24/2014 FINAL  Final   Organism ID, Bacteria ESCHERICHIA COLI  Final      Susceptibility   Escherichia coli - MIC*    AMPICILLIN 4 SENSITIVE Sensitive     AMPICILLIN/SULBACTAM 4 SENSITIVE Sensitive     CEFAZOLIN <=4 SENSITIVE Sensitive     CEFEPIME <=1 SENSITIVE Sensitive     CEFTAZIDIME <=1 SENSITIVE Sensitive     CEFTRIAXONE <=1 SENSITIVE Sensitive     CIPROFLOXACIN <=0.25 SENSITIVE Sensitive     GENTAMICIN <=1 SENSITIVE Sensitive     IMIPENEM <=0.25 SENSITIVE Sensitive     PIP/TAZO <=4 SENSITIVE Sensitive     TOBRAMYCIN <=1 SENSITIVE Sensitive     TRIMETH/SULFA >=320 RESISTANT Resistant     * ESCHERICHIA COLI  Blood culture (routine x 2)     Status: None (Preliminary result)   Collection Time: 06/21/14 12:15 PM    Result Value Ref Range Status   Specimen Description BLOOD LEFT HAND  Final   Special Requests BOTTLES DRAWN AEROBIC ONLY 2CC  Final   Culture   Final           BLOOD CULTURE RECEIVED NO GROWTH TO DATE CULTURE WILL BE HELD FOR 5 DAYS BEFORE ISSUING A FINAL NEGATIVE REPORT Performed at [facility]    Report Status PENDING  Incomplete  Urine culture     Status: None   Collection Time: 06/21/14 12:20 PM  Result Value Ref Range Status   Specimen Description URINE, CATHETERIZED  Final   Special Requests NONE  Final   Colony Count NO GROWTH Performed at [facility]   Final   Culture NO GROWTH Performed at [facility]   Final   Report Status 06/22/2014 FINAL  Final  MRSA PCR Screening     Status: None   Collection Time: 06/21/14  5:34 PM  Result Value Ref Range Status   MRSA by PCR NEGATIVE NEGATIVE Final    Comment:        The GeneXpert MRSA Assay (FDA approved for NASAL specimens only), is one component of a comprehensive MRSA colonization surveillance program. It is not intended to diagnose MRSA infection nor to guide or monitor treatment for MRSA infections.   Surgical pcr screen     Status: Abnormal   Collection Time: 06/24/14 11:27 AM  Result Value Ref Range Status   MRSA, PCR NEGATIVE NEGATIVE Final   Staphylococcus aureus POSITIVE (A) NEGATIVE Final    Comment:        The Xpert SA Assay (FDA approved for NASAL specimens in patients over 9 years of age), is one component of a comprehensive surveillance program.  Test performance has been validated by [employer] for patients greater than or equal to 93 year old. It is not intended to diagnose infection nor to guide or monitor treatment.      BRIEF HOSPITAL COURSE:   Active Problems:   Severe sepsis: Secondary to Escherichia coli bacteremia, and gallstone pancreatitis contributing. Patient was admitted by PCCM to the intensive care unit, provided IV fluids IV antibiotics. Sepsis  pathophysiology has resolved with supportive care. She continues to be on ciprofloxacin for Escherichia coli bacteremia.     Acute renal failure: Likely prerenal secondary to Escherichia coli bacteremia and pancreatitis. Creatinine peaked at 3.81, she was provided supportive care and subsequently creatinine has completely normalized and back to baseline. ACE inhibitor was discontinued on admission given hypotension and ARF.  Gallstone pancreatitis: Patient was admitted with abdominal pain along with nausea/vomiting. CT scan of the abdomen showed cholelithiasis. Patient was managed with supportive care. Subsequently general surgery and gastroenterology was consulted. Suspected that patient likely passed a gallstone. General surgery subsequently the patient for laparoscopic cholecystectomy on 12/28. Postoperatively doing well, diet being advanced, abdomeni is appropriately soft and tender. General surgery has cleared the patient for discharge today. Patient has a follow-up appointment with general surgery on 07/16/14, both patient and family aware of this follow-up appointment.   Escherichia coli bacteremia: Likely biliary origin. On ciprofloxacin, this will be continued for 10 more days on discharge. Patient will need a follow-up appointment with her primary care practitioner in the next few weeks, with repeat blood culture can be done once she has completed antibiotic course to document resolution of bacteremia.   Hypertension: Blood pressure currently controlled without the use of any antihypertensive medications. Will continue to hold lisinopril, I have instructed the patient's daughter to get a follow-up appointment with her PCP in the next year so, where her blood pressure will need to be reevaluated and see if she can be restarted on lisinopril.  TODAY-DAY OF DISCHARGE:  Subjective:   Chelsea Walker today has no headache,no chest abdominal pain,no new weakness tingling or numbness, feels much  better wants to go home today.   Objective:   Blood pressure 102/64, pulse 81, temperature 98.7 F (37.1 C), temperature source Oral, resp. rate 16, height 5\' 6"  (1.676 m), weight 86 kg (189 lb 9.5 oz), SpO2 96 %.  Intake/Output Summary (Last 24 hours) at 06/26/14 1207 Last data filed at 06/26/14 7867  Gross per 24 hour  Intake 2478.17 ml  Output   2075 ml  Net 403.17 ml   Filed Weights   06/21/14 1615  Weight: 86 kg (189 lb 9.5 oz)    Exam Awake Alert, Oriented *3, No new F.N deficits, Normal affect [place].AT,PERRAL Supple Neck,No JVD, No cervical lymphadenopathy appriciated.  Symmetrical Chest wall movement, Good air movement bilaterally, CTAB RRR,No Gallops,Rubs or new Murmurs, No Parasternal Heave +ve B.Sounds, Abd Soft,appr tender, No organomegaly appriciated, No rebound -guarding or rigidity. No Cyanosis, Clubbing or edema, No new Rash or bruise  DISCHARGE CONDITION: Stable  DISPOSITION: Home  DISCHARGE INSTRUCTIONS:    Activity:  As tolerated with Full fall precautions use walker/cane & assistance as needed  Diet recommendation: Heart Healthy diet  Discharge Instructions    Call MD for:  redness, tenderness, or signs of infection (pain, swelling, redness, odor or green/yellow discharge around incision site)    Complete by:  As directed      Diet - low sodium heart healthy    Complete by:  As directed      Increase activity slowly    Complete by:  As directed            Follow-up Information    Follow up with [facility]. Go on 07/16/2014.   Why:  For post-operation check. Your appointment is at 3:00pm, please arrive at least 30 min before your appointment to complete your check in paperwork.  If you are unable to arrive 30 min prior to your appointment time we may have to cancel or reschedule you   Contact information:   [place] [address] [phone]       Schedule an appointment as soon as possible for a  visit in 1 week to follow up.  Why:  Please ask your Primary care MD to repeat Blood cultures once patient has completed course of Ciprofloxacin. Please also ask your primary MD to see if you need to be started back on Lisinoprill   Contact information:   [facility]     Total Time spent on discharge equals 45 minutes.  SignedOren Binet 06/26/2014 12:07 PM

## 2014-06-26 NOTE — Progress Notes (Signed)
[facility] Progress Note  1 Day Post-Op  Subjective: Pt feels great, happy surgery is over with.  No N/V, hungry and wants solids.  Mobilizing some OOB with walker.  Pain minimal.  Looking forward to going home.    Objective: Vital signs in last 24 hours: Temp:  [97.6 F (36.4 C)-99.1 F (37.3 C)] 98.7 F (37.1 C) (12/30 0634) Pulse Rate:  [62-86] 81 (12/30 0634) Resp:  [13-19] 16 (12/30 0634) BP: (102-127)/(64-96) 102/64 mmHg (12/30 0634) SpO2:  [92 %-98 %] 96 % (12/30 0634) Last BM Date: 06/24/14  Intake/Output from previous day: 12/29 0701 - 12/30 0700 In: 2478.2 [P.O.:530; I.V.:1348.2; IV Piggyback:600] Out: 2575 [Urine:2525; Blood:50] Intake/Output this shift:    PE: Gen:  Alert, NAD, pleasant Abd: Soft, mild tenderness, ND, +BS, no HSM, incisions C/D/I   Lab Results:   Recent Labs  06/24/14 0830 06/26/14 0530  WBC 7.1 7.5  HGB 9.9* 8.8*  HCT 29.7* 27.0*  PLT 144* 161   BMET  Recent Labs  06/25/14 0456 06/26/14 0530  NA 139 140  K 3.1* 4.4  CL 116* 112  CO2 19 20  GLUCOSE 82 92  BUN 10 9  CREATININE 0.93 0.95  CALCIUM 7.1* 7.8*   PT/INR No results for input(s): LABPROT, INR in the last 72 hours. CMP     Component Value Date/Time   NA 140 06/26/2014 0530   K 4.4 06/26/2014 0530   CL 112 06/26/2014 0530   CO2 20 06/26/2014 0530   GLUCOSE 92 06/26/2014 0530   BUN 9 06/26/2014 0530   CREATININE 0.95 06/26/2014 0530   CALCIUM 7.8* 06/26/2014 0530   PROT 5.6* 06/26/2014 0530   ALBUMIN 2.3* 06/26/2014 0530   AST 59* 06/26/2014 0530   ALT 50* 06/26/2014 0530   ALKPHOS 108 06/26/2014 0530   BILITOT 1.1 06/26/2014 0530   GFRNONAA 58* 06/26/2014 0530   GFRAA 68* 06/26/2014 0530   Lipase     Component Value Date/Time   LIPASE 25 06/25/2014 0456       Studies/Results: No results found.  Anti-infectives: Anti-infectives    Start     Dose/Rate Route Frequency Ordered Stop   06/23/14 1130  ciprofloxacin (CIPRO) IVPB 400  mg     400 mg200 mL/hr over 60 Minutes Intravenous Every 12 hours 06/23/14 1058     06/21/14 1800  metroNIDAZOLE (FLAGYL) IVPB 500 mg     500 mg100 mL/hr over 60 Minutes Intravenous Every 8 hours 06/21/14 1630     06/21/14 1700  ciprofloxacin (CIPRO) IVPB 400 mg  Status:  Discontinued     400 mg200 mL/hr over 60 Minutes Intravenous Every 24 hours 06/21/14 1643 06/23/14 1058   06/21/14 1230  piperacillin-tazobactam (ZOSYN) IVPB 3.375 g     3.375 g100 mL/hr over 30 Minutes Intravenous  Once 06/21/14 1223 06/21/14 1326       Assessment/Plan Gallstone pancreatitis - POD #1 s/p lap chole and LOA Sepsis with GNR bacteremia - resolved Acute kidney injury - resolved History craniotomy for tumor Hypokalemia  Plan: 1.  Red. IVF, pain control, no more antibiotics needed 2.  Ambulate and IS 3.  SCD's and heparin 4.  Hopefully home today if tolerating regular diet, ambulating well, and pain well controlled 5.  Scheduled f/u appt on 07/16/13 at 3pm with [facility]    LOS: 5 days    Coralie Keens 06/26/2014, 9:14 AM Pager: [phone]

## 2014-06-26 NOTE — Progress Notes (Signed)
Discharge instructions gone over with patient and daughter. Prescriptions given. Home medications gone over. Incisional care gone over. Diet , activity, and signs and symptoms of infection gone over. Follow up appointments are made. Patient verbalized understanding of instructions.

## 2014-06-26 NOTE — Discharge Instructions (Signed)
Your appointment is at 3:00pm, please arrive at least 30 min before your appointment to complete your check in paperwork.  If you are unable to arrive 30 min prior to your appointment time we may have to cancel or reschedule you.  LAPAROSCOPIC SURGERY: POST OP INSTRUCTIONS  1. DIET: Follow a light bland diet the first 24 hours after arrival home, such as soup, liquids, crackers, etc. Be sure to include lots of fluids daily. Avoid fast food or heavy meals as your are more likely to get nauseated. Eat a low fat the next few days after surgery.  2. Take your usually prescribed home medications unless otherwise directed. 3. PAIN CONTROL:  1. Pain is best controlled by a usual combination of three different methods TOGETHER:  1. Ice/Heat 2. Over the counter pain medication 3. Prescription pain medication 2. Most patients will experience some swelling and bruising around the incisions. Ice packs or heating pads (30-60 minutes up to 6 times a day) will help. Use ice for the first few days to help decrease swelling and bruising, then switch to heat to help relax tight/sore spots and speed recovery. Some people prefer to use ice alone, heat alone, alternating between ice & heat. Experiment to what works for you. Swelling and bruising can take several weeks to resolve.  3. It is helpful to take an over-the-counter pain medication regularly for the first few weeks. Choose one of the following that works best for you:  1. Naproxen (Aleve, etc) Two 220mg  tabs twice a day 2. Ibuprofen (Advil, etc) Three 200mg  tabs four times a day (every meal & bedtime) 3. Acetaminophen (Tylenol, etc) 500-650mg  four times a day (every meal & bedtime) 4. A prescription for pain medication (such as oxycodone, hydrocodone, etc) should be given to you upon discharge. Take your pain medication as prescribed.  1. If you are having problems/concerns with the prescription medicine (does not control pain, nausea, vomiting, rash, itching,  etc), please call us [phone] to see if we need to switch you to a different pain medicine that will work better for you and/or control your side effect better. 2. If you need a refill on your pain medication, please contact your pharmacy. They will contact our office to request authorization. Prescriptions will not be filled after 5 pm or on week-ends. 4. Avoid getting constipated. Between the surgery and the pain medications, it is common to experience some constipation. Increasing fluid intake and taking a fiber supplement (such as Metamucil, Citrucel, FiberCon, MiraLax, etc) 1-2 times a day regularly will usually help prevent this problem from occurring. A mild laxative (prune juice, Milk of Magnesia, MiraLax, etc) should be taken according to package directions if there are no bowel movements after 48 hours.  5. Watch out for diarrhea. If you have many loose bowel movements, simplify your diet to bland foods & liquids for a few days. Stop any stool softeners and decrease your fiber supplement. Switching to mild anti-diarrheal medications (Kayopectate, Pepto Bismol) can help. If this worsens or does not improve, please call us. 6. Wash / shower every day. You may shower over the dressings as they are waterproof. Continue to shower over incision(s) after the dressing is off. 7. Remove your waterproof bandages 5 days after surgery. You may leave the incision open to air. You may replace a dressing/Band-Aid to cover the incision for comfort if you wish.  8. ACTIVITIES as tolerated:  1. You may resume regular (light) daily activities beginning the next day--such as  walking, climbing stairs--gradually increasing activities as tolerated. If you can walk 30 minutes without difficulty, it is safe to try more intense activity such as jogging, treadmill, bicycling, low-impact aerobics, swimming, etc. °2. Save the most intensive and strenuous activity for last such as sit-ups, heavy lifting,  contact sports, etc Refrain from any heavy lifting or straining until you are off narcotics for pain control.  °3. DO NOT PUSH THROUGH PAIN. Let pain be your guide: If it hurts to do something, don't do it. Pain is your body warning you to avoid that activity for another week until the pain goes down. °4. You may drive when you are no longer taking prescription pain medication, you can comfortably wear a seatbelt, and you can safely maneuver your car and apply brakes. °5. You may have sexual intercourse when it is comfortable.  °9. FOLLOW UP in our office  °1. Please call [facility] at [phone] to set up an appointment to see your surgeon in the office for a follow-up appointment approximately 2-3 weeks after your surgery. °2. Make sure that you call for this appointment the day you arrive home to insure a convenient appointment time. °     10. IF YOU HAVE DISABILITY OR FAMILY LEAVE FORMS, BRING THEM TO THE               OFFICE FOR PROCESSING.  ° °WHEN TO CALL US [phone]:  °1. Poor pain control °2. Reactions / problems with new medications (rash/itching, nausea, etc)  °3. Fever over 101.5 F (38.5 C) °4. Inability to urinate °5. Nausea and/or vomiting °6. Worsening swelling or bruising °7. Continued bleeding from incision. °8. Increased pain, redness, or drainage from the incision ° °The clinic staff is available to answer your questions during regular business hours (8:30am-5pm). Please don’t hesitate to call and ask to speak to one of our nurses for clinical concerns.  °If you have a medical emergency, go to the nearest emergency room or call 911.  °A surgeon from [facility] is always on call at the hospitals  ° °[facility]  °[address], [address], [place], [place] [address] ?  °MAIN: [phone] ? TOLL FREE: [phone] ?  °FAX [phone]  °[email or web address] ° °

## 2014-06-27 LAB — CULTURE, BLOOD (ROUTINE X 2)

## 2016-07-10 IMAGING — CT CT ABD-PELV W/O CM
2 of 4 series · 17 of 46 positions shown, 19 images · non-contrast
Comparison: None.

CLINICAL DATA: Vomiting with eating.  Back pain.

EXAM:
CT ABDOMEN AND PELVIS WITHOUT CONTRAST
TECHNIQUE: Multidetector CT imaging of the abdomen and pelvis was performed
following the standard protocol without IV contrast.

[Series 3: abd/pelvis 5.0 b31f · axial · 0.90mm/px · z∈[+976,+1366]mm · 14 of 86 slices shown, 16 images]
[im 4/86  soft-tissue]
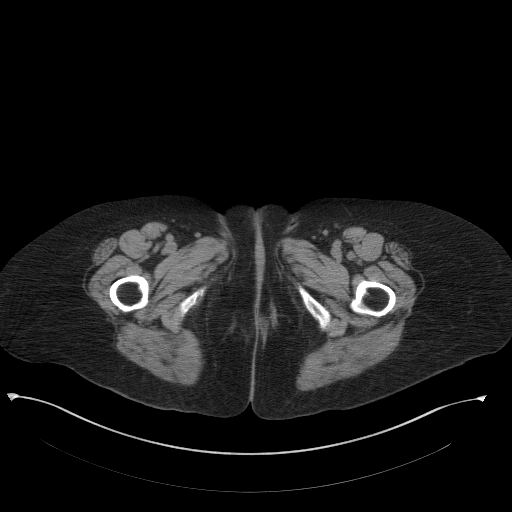
[im 4/86  bone]
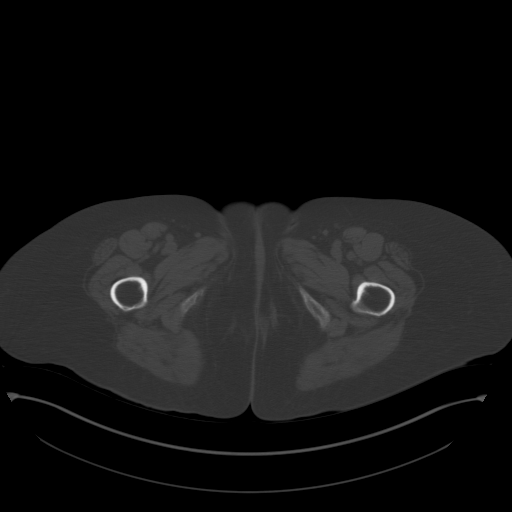
[im 10/86  soft-tissue]
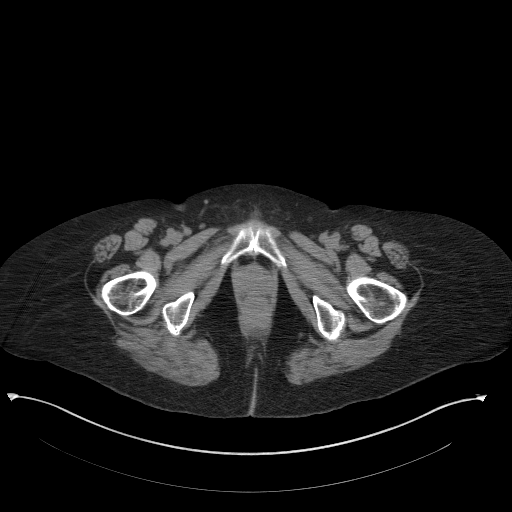
[im 17/86  soft-tissue]
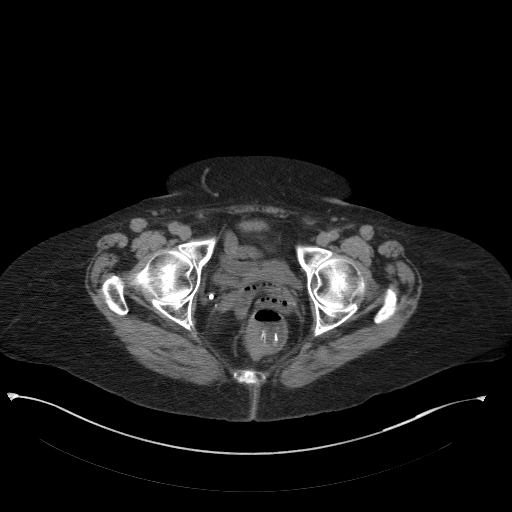
[im 23/86  soft-tissue]
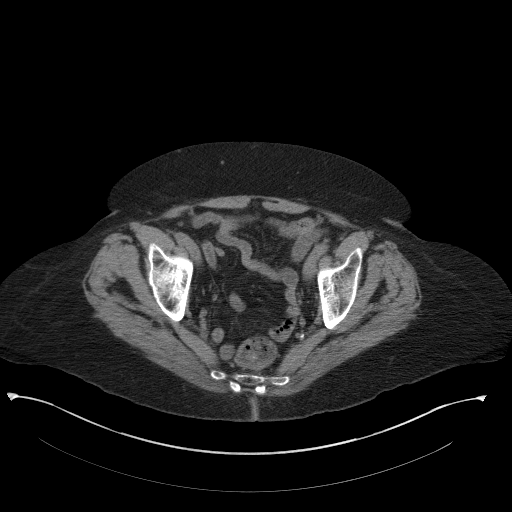
[im 30/86  soft-tissue]
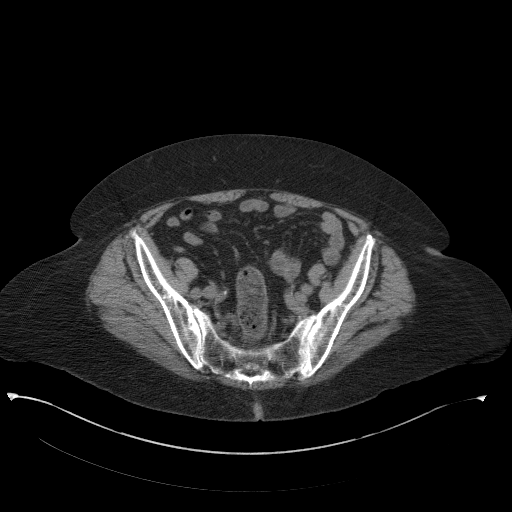
[im 33/86  soft-tissue]
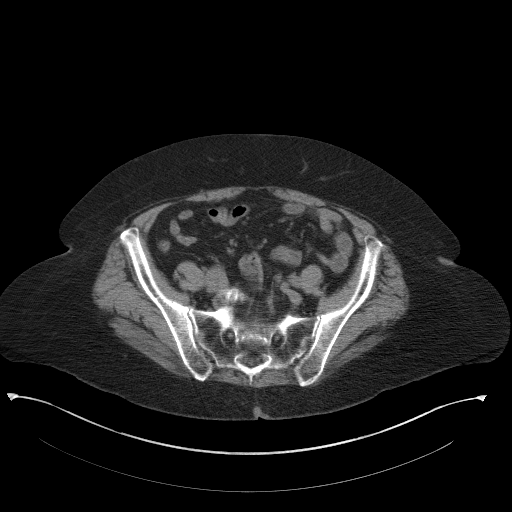
[im 40/86  soft-tissue]
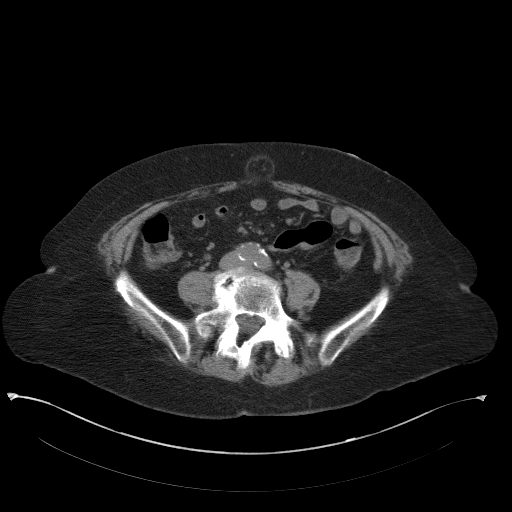
[im 46/86  soft-tissue]
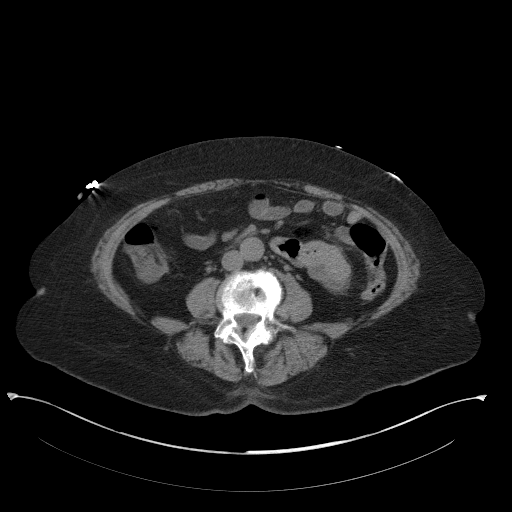
[im 53/86  soft-tissue]
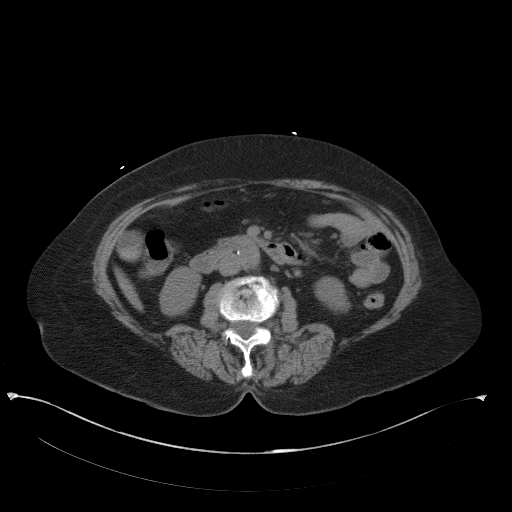
[im 53/86  bone]
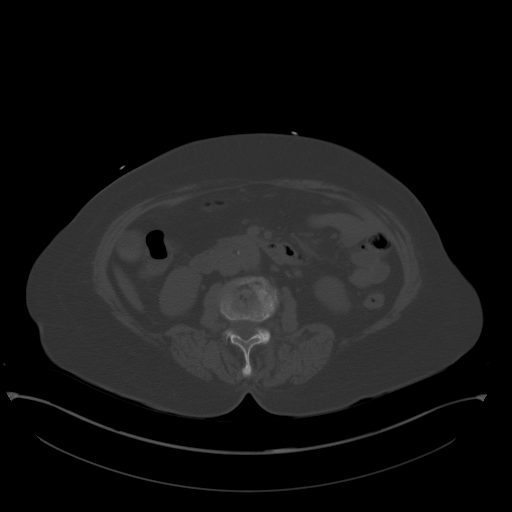
[im 56/86  soft-tissue]
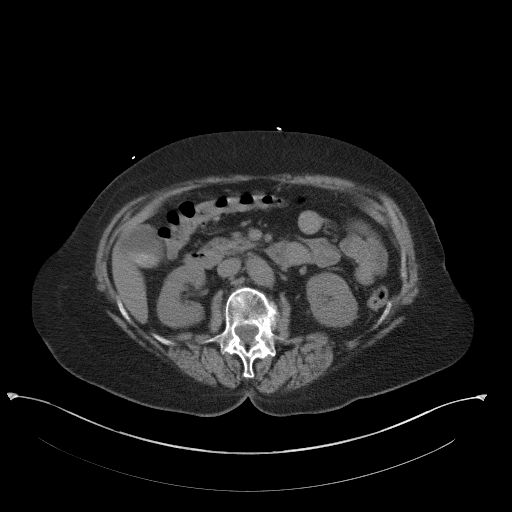
[im 63/86  soft-tissue]
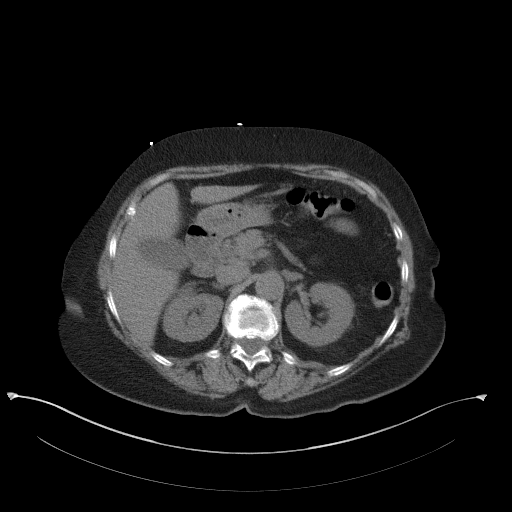
[im 69/86  soft-tissue]
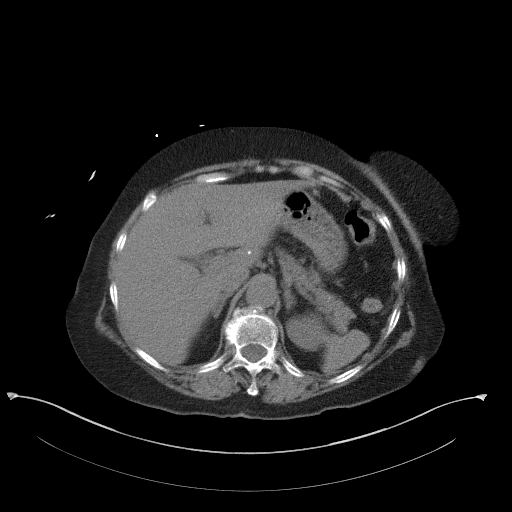
[im 76/86  soft-tissue]
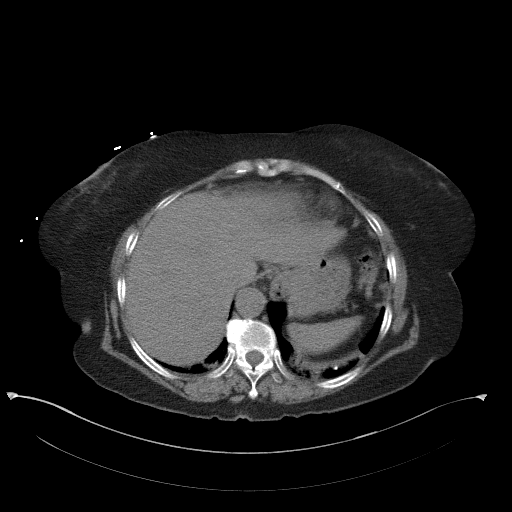
[im 82/86  soft-tissue]
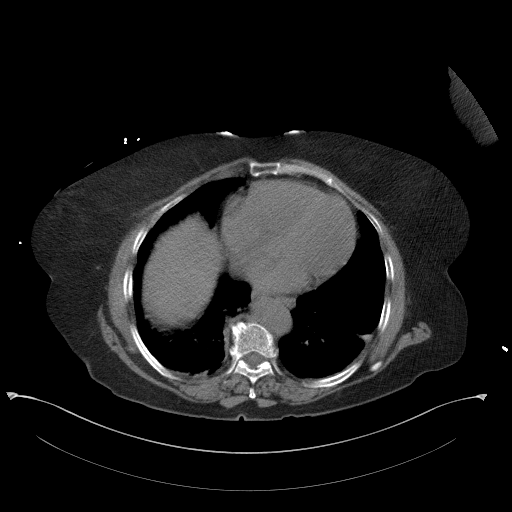

[Series 6: abd/pelvis 3.0 coronal · coronal · 0.95mm/px · 3 of 89 slices shown]
[im 30/89  soft-tissue]
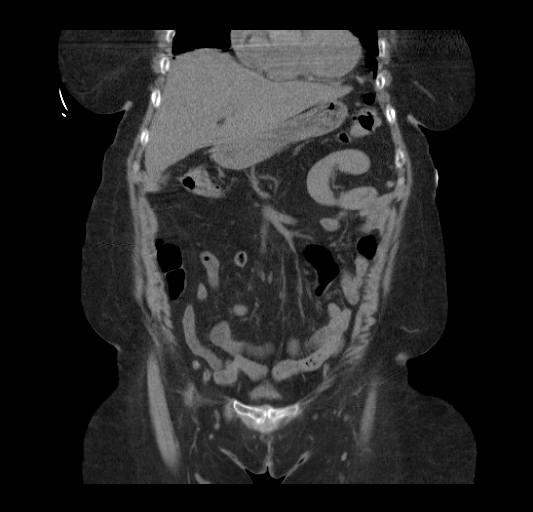
[im 40/89  soft-tissue]
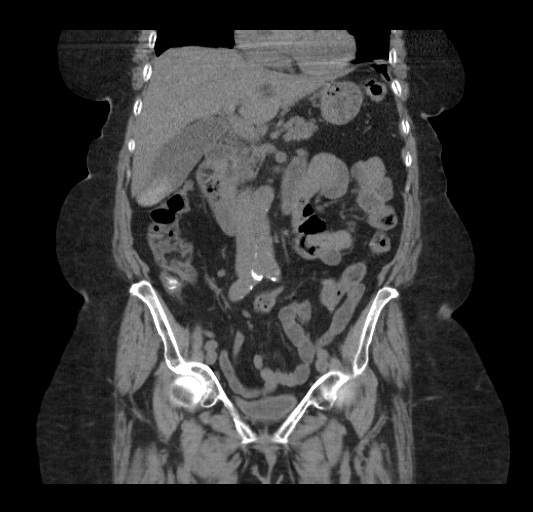
[im 49/89  soft-tissue]
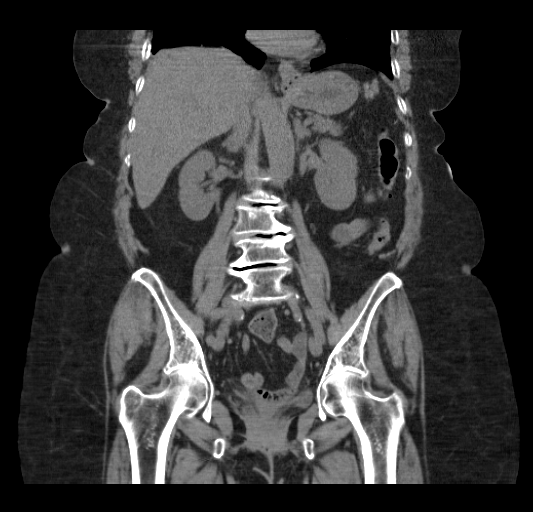

[17 of 46 positions shown; findings below may reference images not displayed]

FINDINGS: Linear densities at both lung bases are most compatible with
atelectasis. Evidence for a small calcified granuloma at the left
lung base. Negative for free intraperitoneal air.

There is high-density material in the gallbladder consistent with
stones and/or sludge. Gallbladder is mildly distended. No gross
abnormality to the liver, spleen or pancreas. Normal appearance of
the adrenal glands and kidneys. Negative for kidney stones or
hydronephrosis. Abdominal aorta measures up to 2.3 cm with
atherosclerotic calcifications. No significant free fluid or
lymphadenopathy. Uterus has been removed. Normal appearance of the
small bowel, appendix and colon. Evidence for a small splenule
inferior to the spleen. There is some colonic diverticulosis without
acute inflammation. Two linear densities in the rectum are
consistent with ingested material. Urinary bladder is decompressed.

There is grade 2 anterolisthesis at L5-S1. There may be a
right-sided pars defect at L5. Multilevel disc disease in lumbar
spine. There is an umbilical hernia containing fat.
IMPRESSION: Cholelithiasis.

Multilevel disc disease in the lumbar spine with anterolisthesis at
L5-S1.

## 2022-07-29 DEATH — deceased
